# Patient Record
Sex: Male | Born: 1935 | Race: White | Hispanic: No | Marital: Married | Smoking: Former smoker
Health system: Southern US, Community
[De-identification: ages and names within clinical notes are randomized; demographics above are authoritative.]

## PROBLEM LIST (undated history)

## (undated) DIAGNOSIS — K219 Gastro-esophageal reflux disease without esophagitis: Secondary | ICD-10-CM

## (undated) DIAGNOSIS — I10 Essential (primary) hypertension: Secondary | ICD-10-CM

## (undated) DIAGNOSIS — B019 Varicella without complication: Secondary | ICD-10-CM

## (undated) DIAGNOSIS — B059 Measles without complication: Secondary | ICD-10-CM

## (undated) HISTORY — PX: TONSILLECTOMY: SUR1361

## (undated) HISTORY — DX: Essential (primary) hypertension: I10

## (undated) HISTORY — DX: Varicella without complication: B01.9

## (undated) HISTORY — DX: Gastro-esophageal reflux disease without esophagitis: K21.9

## (undated) HISTORY — DX: Measles without complication: B05.9

---

## 2001-02-04 ENCOUNTER — Encounter: Admission: RE | Admit: 2001-02-04 | Discharge: 2001-02-04 | Payer: Self-pay | Admitting: Family Medicine

## 2001-02-04 ENCOUNTER — Encounter: Payer: Self-pay | Admitting: Family Medicine

## 2004-06-20 ENCOUNTER — Ambulatory Visit: Payer: Self-pay | Admitting: Family Medicine

## 2004-10-28 ENCOUNTER — Emergency Department (HOSPITAL_COMMUNITY): Admission: EM | Admit: 2004-10-28 | Discharge: 2004-10-28 | Payer: Self-pay | Admitting: Emergency Medicine

## 2004-11-10 ENCOUNTER — Emergency Department (HOSPITAL_COMMUNITY): Admission: EM | Admit: 2004-11-10 | Discharge: 2004-11-10 | Payer: Self-pay | Admitting: *Deleted

## 2006-03-06 ENCOUNTER — Ambulatory Visit: Payer: Self-pay | Admitting: Gastroenterology

## 2006-03-21 ENCOUNTER — Ambulatory Visit: Payer: Self-pay | Admitting: Gastroenterology

## 2006-03-21 ENCOUNTER — Encounter (INDEPENDENT_AMBULATORY_CARE_PROVIDER_SITE_OTHER): Payer: Self-pay | Admitting: Gastroenterology

## 2006-08-06 ENCOUNTER — Ambulatory Visit: Payer: Self-pay | Admitting: Family Medicine

## 2006-08-06 LAB — CONVERTED CEMR LAB
ALT: 21 units/L (ref 0–40)
AST: 22 units/L (ref 0–37)
Albumin: 3.5 g/dL (ref 3.5–5.2)
Alkaline Phosphatase: 69 units/L (ref 39–117)
BUN: 12 mg/dL (ref 6–23)
Basophils Absolute: 0 10*3/uL (ref 0.0–0.1)
Basophils Relative: 0.5 % (ref 0.0–1.0)
Bilirubin, Direct: 0.2 mg/dL (ref 0.0–0.3)
CO2: 30 meq/L (ref 19–32)
Calcium: 9 mg/dL (ref 8.4–10.5)
Chloride: 104 meq/L (ref 96–112)
Cholesterol: 170 mg/dL (ref 0–200)
Creatinine, Ser: 0.8 mg/dL (ref 0.4–1.5)
Eosinophils Absolute: 0.2 10*3/uL (ref 0.0–0.6)
Eosinophils Relative: 2.3 % (ref 0.0–5.0)
GFR calc Af Amer: 121 mL/min
GFR calc non Af Amer: 100 mL/min
Glucose, Bld: 92 mg/dL (ref 70–99)
HCT: 46 % (ref 39.0–52.0)
HDL: 48.4 mg/dL (ref >39.0)
Hemoglobin: 15.7 g/dL (ref 13.0–17.0)
LDL Cholesterol: 109 mg/dL — ABNORMAL HIGH (ref 0–99)
Lymphocytes Relative: 21.3 % (ref 12.0–46.0)
MCHC: 34.1 g/dL (ref 30.0–36.0)
MCV: 91.5 fL (ref 78.0–100.0)
Monocytes Absolute: 0.7 10*3/uL (ref 0.2–0.7)
Monocytes Relative: 10.8 % (ref 3.0–11.0)
Neutro Abs: 4.5 10*3/uL (ref 1.4–7.7)
Neutrophils Relative %: 65.1 % (ref 43.0–77.0)
PSA: 0.95 ng/mL (ref 0.10–4.00)
Platelets: 207 10*3/uL (ref 150–400)
Potassium: 4.3 meq/L (ref 3.5–5.1)
RBC: 5.03 M/uL (ref 4.22–5.81)
RDW: 12.7 % (ref 11.5–14.6)
Sodium: 141 meq/L (ref 135–145)
TSH: 0.14 microintl units/mL — ABNORMAL LOW (ref 0.35–5.50)
Total Bilirubin: 0.9 mg/dL (ref 0.3–1.2)
Total CHOL/HDL Ratio: 3.5
Total Protein: 6.7 g/dL (ref 6.0–8.3)
Triglycerides: 64 mg/dL (ref 0–149)
VLDL: 13 mg/dL (ref 0–40)
WBC: 6.8 10*3/uL (ref 4.5–10.5)

## 2006-08-07 ENCOUNTER — Ambulatory Visit: Payer: Self-pay

## 2006-10-29 ENCOUNTER — Ambulatory Visit: Payer: Self-pay | Admitting: Family Medicine

## 2007-02-07 ENCOUNTER — Encounter: Payer: Self-pay | Admitting: Family Medicine

## 2007-03-24 ENCOUNTER — Ambulatory Visit: Payer: Self-pay | Admitting: Family Medicine

## 2007-03-25 DIAGNOSIS — I10 Essential (primary) hypertension: Secondary | ICD-10-CM

## 2007-03-25 DIAGNOSIS — R42 Dizziness and giddiness: Secondary | ICD-10-CM

## 2007-03-25 DIAGNOSIS — K219 Gastro-esophageal reflux disease without esophagitis: Secondary | ICD-10-CM

## 2007-04-22 ENCOUNTER — Telehealth: Payer: Self-pay | Admitting: Family Medicine

## 2007-04-22 ENCOUNTER — Ambulatory Visit: Payer: Self-pay | Admitting: Family Medicine

## 2007-09-02 ENCOUNTER — Telehealth: Payer: Self-pay | Admitting: Family Medicine

## 2007-09-03 ENCOUNTER — Telehealth: Payer: Self-pay | Admitting: Family Medicine

## 2007-09-04 ENCOUNTER — Ambulatory Visit: Payer: Self-pay | Admitting: Family Medicine

## 2007-10-24 ENCOUNTER — Ambulatory Visit: Payer: Self-pay | Admitting: Family Medicine

## 2007-10-24 DIAGNOSIS — R609 Edema, unspecified: Secondary | ICD-10-CM

## 2007-11-24 ENCOUNTER — Telehealth: Payer: Self-pay | Admitting: Family Medicine

## 2007-12-09 ENCOUNTER — Telehealth: Payer: Self-pay | Admitting: Family Medicine

## 2007-12-15 ENCOUNTER — Ambulatory Visit: Payer: Self-pay

## 2007-12-15 ENCOUNTER — Encounter: Payer: Self-pay | Admitting: Family Medicine

## 2008-04-02 ENCOUNTER — Telehealth: Payer: Self-pay | Admitting: Family Medicine

## 2008-04-20 ENCOUNTER — Ambulatory Visit: Payer: Self-pay | Admitting: Family Medicine

## 2008-04-26 ENCOUNTER — Encounter: Payer: Self-pay | Admitting: Family Medicine

## 2008-06-28 ENCOUNTER — Encounter: Payer: Self-pay | Admitting: Family Medicine

## 2008-07-21 ENCOUNTER — Telehealth: Payer: Self-pay | Admitting: Family Medicine

## 2008-07-21 DIAGNOSIS — R0989 Other specified symptoms and signs involving the circulatory and respiratory systems: Secondary | ICD-10-CM

## 2008-07-21 DIAGNOSIS — R0609 Other forms of dyspnea: Secondary | ICD-10-CM | POA: Insufficient documentation

## 2008-08-05 ENCOUNTER — Encounter: Payer: Self-pay | Admitting: Family Medicine

## 2008-08-06 ENCOUNTER — Encounter: Payer: Self-pay | Admitting: Cardiology

## 2008-08-06 ENCOUNTER — Ambulatory Visit: Payer: Self-pay | Admitting: Cardiology

## 2008-08-06 DIAGNOSIS — R0602 Shortness of breath: Secondary | ICD-10-CM

## 2008-09-27 ENCOUNTER — Encounter: Payer: Self-pay | Admitting: Family Medicine

## 2009-11-01 ENCOUNTER — Encounter (HOSPITAL_COMMUNITY): Admission: RE | Admit: 2009-11-01 | Discharge: 2009-12-27 | Payer: Self-pay | Admitting: Internal Medicine

## 2010-06-25 LAB — CONVERTED CEMR LAB
ALT: 23 units/L (ref 0–53)
AST: 25 units/L (ref 0–37)
Albumin: 4.1 g/dL (ref 3.5–5.2)
Alkaline Phosphatase: 68 units/L (ref 39–117)
BUN: 12 mg/dL (ref 6–23)
Basophils Absolute: 0 10*3/uL (ref 0.0–0.1)
Basophils Relative: 0 % (ref 0.0–3.0)
Bilirubin, Direct: 0.1 mg/dL (ref 0.0–0.3)
CO2: 32 meq/L (ref 19–32)
Calcium, Total (PTH): 10.1 mg/dL (ref 8.4–10.5)
Calcium: 9.6 mg/dL (ref 8.4–10.5)
Chloride: 102 meq/L (ref 96–112)
Creatinine, Ser: 0.8 mg/dL (ref 0.4–1.5)
Eosinophils Absolute: 0.2 10*3/uL (ref 0.0–0.7)
Eosinophils Relative: 2.4 % (ref 0.0–5.0)
GFR calc Af Amer: 121 mL/min
GFR calc non Af Amer: 100 mL/min
Glucose, Bld: 71 mg/dL (ref 70–99)
HCT: 46.3 % (ref 39.0–52.0)
Hemoglobin: 15.9 g/dL (ref 13.0–17.0)
Lymphocytes Relative: 23.5 % (ref 12.0–46.0)
MCHC: 34.4 g/dL (ref 30.0–36.0)
MCV: 92.9 fL (ref 78.0–100.0)
Monocytes Absolute: 0.8 10*3/uL (ref 0.1–1.0)
Monocytes Relative: 10.8 % (ref 3.0–12.0)
Neutro Abs: 4.7 10*3/uL (ref 1.4–7.7)
Neutrophils Relative %: 63.3 % (ref 43.0–77.0)
PTH: 54.1 pg/mL (ref 14.0–72.0)
Phosphorus: 3.4 mg/dL (ref 2.3–4.6)
Platelets: 195 10*3/uL (ref 150–400)
Potassium: 4.3 meq/L (ref 3.5–5.1)
RBC: 4.99 M/uL (ref 4.22–5.81)
RDW: 12.2 % (ref 11.5–14.6)
Sodium: 141 meq/L (ref 135–145)
Total Bilirubin: 1 mg/dL (ref 0.3–1.2)
Total Protein: 7.5 g/dL (ref 6.0–8.3)
WBC: 7.5 10*3/uL (ref 4.5–10.5)

## 2011-04-05 ENCOUNTER — Encounter: Payer: Self-pay | Admitting: Gastroenterology

## 2011-12-18 DIAGNOSIS — H269 Unspecified cataract: Secondary | ICD-10-CM | POA: Diagnosis not present

## 2012-01-25 DIAGNOSIS — I10 Essential (primary) hypertension: Secondary | ICD-10-CM | POA: Diagnosis not present

## 2012-01-25 DIAGNOSIS — Z125 Encounter for screening for malignant neoplasm of prostate: Secondary | ICD-10-CM | POA: Diagnosis not present

## 2012-02-11 DIAGNOSIS — Z0000 Encounter for general adult medical examination without abnormal findings: Secondary | ICD-10-CM | POA: Diagnosis not present

## 2012-02-11 DIAGNOSIS — Z125 Encounter for screening for malignant neoplasm of prostate: Secondary | ICD-10-CM | POA: Diagnosis not present

## 2012-02-11 DIAGNOSIS — I10 Essential (primary) hypertension: Secondary | ICD-10-CM | POA: Diagnosis not present

## 2012-02-11 DIAGNOSIS — E079 Disorder of thyroid, unspecified: Secondary | ICD-10-CM | POA: Diagnosis not present

## 2012-02-12 DIAGNOSIS — Z1212 Encounter for screening for malignant neoplasm of rectum: Secondary | ICD-10-CM | POA: Diagnosis not present

## 2012-02-15 ENCOUNTER — Encounter: Payer: Self-pay | Admitting: Gastroenterology

## 2012-08-25 ENCOUNTER — Telehealth: Payer: Self-pay | Admitting: Family Medicine

## 2012-08-25 NOTE — Telephone Encounter (Signed)
Pt must sign the DPR form, we cannot discuss pt's medical issues without this. I tried to call and no answer.

## 2012-08-25 NOTE — Telephone Encounter (Signed)
Pt's wife would like to discuss husband's situation. Husband has been experiencing the inability to remember things.  Getting confused more and more often. Husband has refused to let wife come to appointment. She would like to discuss some things prior to his appointment.  Advised wife she needed DPR signed, but I would relay message.

## 2012-09-01 ENCOUNTER — Encounter: Payer: Self-pay | Admitting: Family Medicine

## 2012-09-01 ENCOUNTER — Ambulatory Visit (INDEPENDENT_AMBULATORY_CARE_PROVIDER_SITE_OTHER): Payer: Medicare Other | Admitting: Family Medicine

## 2012-09-01 VITALS — BP 120/70 | HR 49 | Temp 97.5°F | Ht 67.5 in | Wt 186.0 lb

## 2012-09-01 DIAGNOSIS — R42 Dizziness and giddiness: Secondary | ICD-10-CM | POA: Diagnosis not present

## 2012-09-01 DIAGNOSIS — I10 Essential (primary) hypertension: Secondary | ICD-10-CM

## 2012-09-01 DIAGNOSIS — I499 Cardiac arrhythmia, unspecified: Secondary | ICD-10-CM | POA: Diagnosis not present

## 2012-09-01 DIAGNOSIS — I498 Other specified cardiac arrhythmias: Secondary | ICD-10-CM

## 2012-09-01 DIAGNOSIS — R001 Bradycardia, unspecified: Secondary | ICD-10-CM

## 2012-09-01 LAB — BASIC METABOLIC PANEL
Chloride: 105 mEq/L (ref 96–112)
GFR: 86.05 mL/min (ref >60.00)
Potassium: 4.6 mEq/L (ref 3.5–5.1)
Sodium: 139 mEq/L (ref 135–145)

## 2012-09-01 LAB — HEPATIC FUNCTION PANEL
ALT: 20 U/L (ref 0–53)
AST: 22 U/L (ref 0–37)
Alkaline Phosphatase: 70 U/L (ref 39–117)
Bilirubin, Direct: 0.2 mg/dL (ref 0.0–0.3)
Total Bilirubin: 1 mg/dL (ref 0.3–1.2)

## 2012-09-01 LAB — CBC WITH DIFFERENTIAL/PLATELET
Basophils Absolute: 0 10*3/uL (ref 0.0–0.1)
Basophils Relative: 0.4 % (ref 0.0–3.0)
HCT: 45.6 % (ref 39.0–52.0)
Hemoglobin: 15.3 g/dL (ref 13.0–17.0)
Lymphocytes Relative: 15.3 % (ref 12.0–46.0)
Lymphs Abs: 1.2 10*3/uL (ref 0.7–4.0)
MCHC: 33.6 g/dL (ref 30.0–36.0)
Monocytes Relative: 8.8 % (ref 3.0–12.0)
Neutro Abs: 5.9 10*3/uL (ref 1.4–7.7)
RBC: 4.96 Mil/uL (ref 4.22–5.81)
RDW: 13.3 % (ref 11.5–14.6)

## 2012-09-01 LAB — LIPID PANEL
Cholesterol: 172 mg/dL (ref 0–200)
LDL Cholesterol: 105 mg/dL — ABNORMAL HIGH (ref 0–99)
Total CHOL/HDL Ratio: 3

## 2012-09-01 LAB — POCT URINALYSIS DIPSTICK
Bilirubin, UA: NEGATIVE
Glucose, UA: NEGATIVE
Ketones, UA: NEGATIVE
pH, UA: 6

## 2012-09-01 MED ORDER — AMLODIPINE-OLMESARTAN 10-40 MG PO TABS: 1.0000 | ORAL_TABLET | Freq: Every day | ORAL | Status: DC

## 2012-09-01 MED ORDER — NEBIVOLOL HCL 10 MG PO TABS: 10.0000 mg | ORAL_TABLET | Freq: Two times a day (BID) | ORAL | Status: DC

## 2012-09-01 MED ORDER — ESOMEPRAZOLE MAGNESIUM 40 MG PO CPDR: 40.0000 mg | DELAYED_RELEASE_CAPSULE | Freq: Every day | ORAL | Status: DC

## 2012-09-01 NOTE — Progress Notes (Signed)
  Subjective:    Patient ID: Travis Ware, male    DOB: Nov 23, 1929, 79 y.o.   MRN: [id number]  HPI 79 yr old male here to establish after transfering from Dr. Creola Corn. He had a cpx about 8 months ago and apparently he has been fairly healthy. He feels good except for 2 things. He describes some brief dizziness or lightheadedness for a few seconds when he sits up from lying in bed or when he stands up from sitting. This passes quickly and he denies any other symptoms. No palpitations or SOB or chest pains. He says this started about 6 months ago. 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 He feels like he can do whatever her wants to do. He also mentions some intermittent left shoulder pain for the past few months. No hx of trauma. 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 They often travel to [place] to search for antiques.    Review of Systems  Constitutional: Negative.   HENT: Negative.   Eyes: Negative.   Respiratory: Negative.   Cardiovascular: Negative.   Gastrointestinal: Negative.   Musculoskeletal: Positive for arthralgias.  Neurological: Positive for dizziness and light-headedness. Negative for tremors, seizures, syncope, facial asymmetry, speech difficulty, weakness, numbness and headaches.       Objective:   Physical Exam  Constitutional: He is oriented to person, place, and time. He appears well-developed and well-nourished. No distress.  Eyes: Conjunctivae are normal. Pupils are equal, round, and reactive to light.  Neck: No thyromegaly present.  Cardiovascular: Regular rhythm, normal heart sounds and intact distal pulses.  Exam reveals no gallop and no friction rub.   No murmur heard. No carotid bruits are heard. EKG shows sinus bradycardia   Pulmonary/Chest: Effort normal and breath sounds normal. No respiratory distress. He has no wheezes. He has no rales.  Lymphadenopathy:    He has no  cervical adenopathy.  Neurological: He is alert and oriented to person, place, and time. No cranial nerve deficit. He exhibits normal muscle tone. Coordination normal.          Assessment & Plan:  Introductory visit for this 79 yr old male who complains of probable postural hypotension. He is also very bradycardic which is contributing to the problem. We will decrease the dose of his Bystolic to 10 mg bid and get some baseline labs today. Set up an ECHO and some carotid dopplers soon. Recheck with me in 2 weeks.

## 2012-09-04 NOTE — Progress Notes (Signed)
Quick Note:  I left voice message with results. ______ 

## 2012-09-05 ENCOUNTER — Encounter (INDEPENDENT_AMBULATORY_CARE_PROVIDER_SITE_OTHER): Payer: Medicare Other

## 2012-09-05 DIAGNOSIS — I6529 Occlusion and stenosis of unspecified carotid artery: Secondary | ICD-10-CM

## 2012-09-05 DIAGNOSIS — R42 Dizziness and giddiness: Secondary | ICD-10-CM

## 2012-09-08 ENCOUNTER — Ambulatory Visit (HOSPITAL_COMMUNITY): Payer: Medicare Other | Attending: Cardiovascular Disease

## 2012-09-08 DIAGNOSIS — I498 Other specified cardiac arrhythmias: Secondary | ICD-10-CM | POA: Insufficient documentation

## 2012-09-08 DIAGNOSIS — R42 Dizziness and giddiness: Secondary | ICD-10-CM | POA: Insufficient documentation

## 2012-09-08 DIAGNOSIS — I495 Sick sinus syndrome: Secondary | ICD-10-CM

## 2012-09-08 NOTE — Progress Notes (Signed)
Echocardiogram performed.  

## 2012-09-15 ENCOUNTER — Ambulatory Visit (INDEPENDENT_AMBULATORY_CARE_PROVIDER_SITE_OTHER): Payer: Medicare Other | Admitting: Family Medicine

## 2012-09-15 ENCOUNTER — Encounter: Payer: Self-pay | Admitting: Family Medicine

## 2012-09-15 VITALS — BP 130/84 | HR 51 | Temp 97.6°F | Wt 189.0 lb

## 2012-09-15 DIAGNOSIS — R42 Dizziness and giddiness: Secondary | ICD-10-CM | POA: Diagnosis not present

## 2012-09-15 DIAGNOSIS — I10 Essential (primary) hypertension: Secondary | ICD-10-CM

## 2012-09-15 NOTE — Progress Notes (Signed)
Quick Note:  Pt here today for OV, will discuss then. ______

## 2012-09-15 NOTE — Progress Notes (Signed)
  Subjective:    Patient ID: Travis Ware, male    DOB: 10-Mar-1930, 79 y.o.   MRN: [id number]  HPI Here to follow up on some brief spells of dizziness when he stands up from sitting or gets out of bed. We felt this represented some orthostasis. We decreased his Bystolic to 10 mg bid and this has helped somewhat. He had labs, an ECHO, and carotid dopplers in the past 2 weeks, and all these came out unremarkable.    Review of Systems  Constitutional: Negative.   Respiratory: Negative.   Cardiovascular: Negative.   Neurological: Positive for dizziness.       Objective:   Physical Exam  Constitutional: He is oriented to person, place, and time. He appears well-developed and well-nourished. No distress.  Neck: No thyromegaly present.  Cardiovascular: Normal rate, regular rhythm, normal heart sounds and intact distal pulses.   Pulmonary/Chest: Effort normal and breath sounds normal.  Lymphadenopathy:    He has no cervical adenopathy.  Neurological: He is alert and oriented to person, place, and time.          Assessment & Plan:  He seems to tolerate the dizziness well, so we agreed to simply observe this for now. Recheck prn

## 2012-09-15 NOTE — Progress Notes (Signed)
Quick Note:  Pt is here now for office visit and will go over then. ______

## 2012-11-03 ENCOUNTER — Telehealth: Payer: Self-pay | Admitting: Family Medicine

## 2012-11-03 NOTE — Telephone Encounter (Signed)
Call attempt to patient regarding medication concerns/refill request. No answer. Voicemail message left for patient to return call to office number. No contact with patient.

## 2012-11-05 ENCOUNTER — Ambulatory Visit (INDEPENDENT_AMBULATORY_CARE_PROVIDER_SITE_OTHER): Payer: Medicare Other | Admitting: Family Medicine

## 2012-11-05 ENCOUNTER — Encounter: Payer: Self-pay | Admitting: Family Medicine

## 2012-11-05 VITALS — BP 124/86 | HR 98 | Temp 97.6°F | Wt 185.0 lb

## 2012-11-05 DIAGNOSIS — I10 Essential (primary) hypertension: Secondary | ICD-10-CM | POA: Diagnosis not present

## 2012-11-05 DIAGNOSIS — K219 Gastro-esophageal reflux disease without esophagitis: Secondary | ICD-10-CM | POA: Diagnosis not present

## 2012-11-05 NOTE — Progress Notes (Signed)
  Subjective:    Patient ID: Travis Ware, male    DOB: June 29, 1929, 79 y.o.   MRN: [id number]  HPI Here to follow up. His dizziness has improved quite a bit and he no longer complains of this. He stopped all his medications a month ago because he did not think he needed them. He feels fine.    Review of Systems  Constitutional: Negative.   Respiratory: Negative.   Cardiovascular: Negative.   Neurological: Negative.        Objective:   Physical Exam  Constitutional: He appears well-developed and well-nourished.  Cardiovascular: Normal rate, regular rhythm, normal heart sounds and intact distal pulses.   Pulmonary/Chest: Effort normal and breath sounds normal.          Assessment & Plan:  He seems to be doing well. We will get a cpx with labs in August. I asked him to check his BP twice a week at home.

## 2013-02-05 ENCOUNTER — Encounter: Payer: Self-pay | Admitting: Family Medicine

## 2013-02-05 ENCOUNTER — Ambulatory Visit (INDEPENDENT_AMBULATORY_CARE_PROVIDER_SITE_OTHER): Payer: Medicare Other | Admitting: Family Medicine

## 2013-02-05 VITALS — BP 112/80 | HR 64 | Temp 97.6°F | Ht 67.5 in | Wt 179.0 lb

## 2013-02-05 DIAGNOSIS — I10 Essential (primary) hypertension: Secondary | ICD-10-CM | POA: Diagnosis not present

## 2013-02-05 DIAGNOSIS — R0609 Other forms of dyspnea: Secondary | ICD-10-CM | POA: Diagnosis not present

## 2013-02-05 DIAGNOSIS — N139 Obstructive and reflux uropathy, unspecified: Secondary | ICD-10-CM

## 2013-02-05 DIAGNOSIS — K219 Gastro-esophageal reflux disease without esophagitis: Secondary | ICD-10-CM | POA: Diagnosis not present

## 2013-02-05 DIAGNOSIS — N401 Enlarged prostate with lower urinary tract symptoms: Secondary | ICD-10-CM | POA: Diagnosis not present

## 2013-02-05 DIAGNOSIS — N138 Other obstructive and reflux uropathy: Secondary | ICD-10-CM

## 2013-02-05 DIAGNOSIS — R0989 Other specified symptoms and signs involving the circulatory and respiratory systems: Secondary | ICD-10-CM

## 2013-02-05 DIAGNOSIS — R42 Dizziness and giddiness: Secondary | ICD-10-CM

## 2013-02-05 NOTE — Progress Notes (Signed)
  Subjective:    Patient ID: Travis Ware, male    DOB: 18-Mar-1930, 79 y.o.   MRN: [id number]  HPI 79 yr old male for a cpx. He feels well. He stopped his BP meds several months ago and his BP has remained well controlled. 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    Review of Systems  Constitutional: Negative.   HENT: Negative.   Eyes: Negative.   Respiratory: Negative.   Cardiovascular: Negative.   Gastrointestinal: Negative.   Genitourinary: Negative.   Musculoskeletal: Negative.   Skin: Negative.   Neurological: Negative.   Psychiatric/Behavioral: Negative.        Objective:   Physical Exam  Constitutional: He is oriented to person, place, and time. He appears well-developed and well-nourished. No distress.  HENT:  Head: Normocephalic and atraumatic.  Right Ear: External ear normal.  Left Ear: External ear normal.  Nose: Nose normal.  Mouth/Throat: Oropharynx is clear and moist. No oropharyngeal exudate.  Eyes: Conjunctivae and EOM are normal. Pupils are equal, round, and reactive to light. Right eye exhibits no discharge. Left eye exhibits no discharge. No scleral icterus.  Neck: Neck supple. No JVD present. No tracheal deviation present. No thyromegaly present.  Cardiovascular: Normal rate, regular rhythm, normal heart sounds and intact distal pulses.  Exam reveals no gallop and no friction rub.   No murmur heard. Pulmonary/Chest: Effort normal and breath sounds normal. No respiratory distress. He has no wheezes. He has no rales. He exhibits no tenderness.  Abdominal: Soft. Bowel sounds are normal. He exhibits no distension and no mass. There is no tenderness. There is no rebound and no guarding.  Genitourinary: Rectum normal, prostate normal and penis normal. Guaiac negative stool. No penile tenderness.  Musculoskeletal: Normal range of motion. He exhibits no edema and no tenderness.  Lymphadenopathy:    He has no cervical adenopathy.  Neurological: He is  alert and oriented to person, place, and time. He has normal reflexes. No cranial nerve deficit. He exhibits normal muscle tone. Coordination normal.  Skin: Skin is warm and dry. No rash noted. He is not diaphoretic. No erythema. No pallor.  Psychiatric: He has a normal mood and affect. His behavior is normal. Judgment and thought content normal.          Assessment & Plan:  Well exam. Get a PSA today.

## 2013-02-06 NOTE — Progress Notes (Signed)
Quick Note:  I left voice message with results. ______ 

## 2013-06-22 DIAGNOSIS — M19019 Primary osteoarthritis, unspecified shoulder: Secondary | ICD-10-CM | POA: Diagnosis not present

## 2014-02-03 ENCOUNTER — Encounter: Payer: Self-pay | Admitting: Family Medicine

## 2014-02-03 ENCOUNTER — Ambulatory Visit (INDEPENDENT_AMBULATORY_CARE_PROVIDER_SITE_OTHER): Payer: Medicare Other | Admitting: Family Medicine

## 2014-02-03 VITALS — BP 153/76 | HR 67 | Temp 97.7°F | Ht 67.5 in | Wt 183.0 lb

## 2014-02-03 DIAGNOSIS — M545 Low back pain, unspecified: Secondary | ICD-10-CM

## 2014-02-03 DIAGNOSIS — Z23 Encounter for immunization: Secondary | ICD-10-CM | POA: Diagnosis not present

## 2014-02-03 NOTE — Progress Notes (Signed)
   Subjective:    Patient ID: Travis Ware, male    DOB: 25-Oct-1929, 80 y.o.   MRN: [id number]  HPI Here for 5 days of low back pain. This started while he was on vacation at the beach last weekend and the car ride home seemed to make it worse. He was stiff and sore but no pain went to the legs. He did nothing for it, but it has been gradually improving every day since then.    Review of Systems  Constitutional: Negative.   Musculoskeletal: Positive for back pain.       Objective:   Physical Exam  Constitutional: He appears well-developed and well-nourished. No distress.  Musculoskeletal:  The lower back is normal today with no tenderness or spasm, full ROM           Assessment & Plan:  He seems to be recovering well. He probably has some arthritis in the spine. Next time I advised him to try heat and ibuprofen

## 2014-02-03 NOTE — Progress Notes (Signed)
Pre visit review using our clinic review tool, if applicable. No additional management support is needed unless otherwise documented below in the visit note. 

## 2014-02-08 ENCOUNTER — Ambulatory Visit (INDEPENDENT_AMBULATORY_CARE_PROVIDER_SITE_OTHER): Payer: Medicare Other | Admitting: Family Medicine

## 2014-02-08 ENCOUNTER — Encounter: Payer: Medicare Other | Admitting: Family Medicine

## 2014-02-08 ENCOUNTER — Encounter: Payer: Self-pay | Admitting: Family Medicine

## 2014-02-08 VITALS — BP 174/83 | HR 62 | Ht 68.0 in | Wt 184.0 lb

## 2014-02-08 DIAGNOSIS — N138 Other obstructive and reflux uropathy: Secondary | ICD-10-CM

## 2014-02-08 DIAGNOSIS — I10 Essential (primary) hypertension: Secondary | ICD-10-CM | POA: Diagnosis not present

## 2014-02-08 DIAGNOSIS — R609 Edema, unspecified: Secondary | ICD-10-CM

## 2014-02-08 DIAGNOSIS — N139 Obstructive and reflux uropathy, unspecified: Secondary | ICD-10-CM | POA: Diagnosis not present

## 2014-02-08 DIAGNOSIS — N401 Enlarged prostate with lower urinary tract symptoms: Secondary | ICD-10-CM | POA: Diagnosis not present

## 2014-02-08 DIAGNOSIS — R413 Other amnesia: Secondary | ICD-10-CM

## 2014-02-08 DIAGNOSIS — H538 Other visual disturbances: Secondary | ICD-10-CM

## 2014-02-08 DIAGNOSIS — K219 Gastro-esophageal reflux disease without esophagitis: Secondary | ICD-10-CM

## 2014-02-08 LAB — PSA: PSA: 1.3 ng/mL (ref 0.10–4.00)

## 2014-02-08 LAB — CBC WITH DIFFERENTIAL/PLATELET
Basophils Absolute: 0 10*3/uL (ref 0.0–0.1)
Basophils Relative: 0.4 % (ref 0.0–3.0)
EOS PCT: 3.3 % (ref 0.0–5.0)
Eosinophils Absolute: 0.3 10*3/uL (ref 0.0–0.7)
HEMATOCRIT: 50.2 % (ref 39.0–52.0)
HEMOGLOBIN: 16.7 g/dL (ref 13.0–17.0)
Lymphocytes Relative: 18.2 % (ref 12.0–46.0)
Lymphs Abs: 1.4 10*3/uL (ref 0.7–4.0)
MCHC: 33.3 g/dL (ref 30.0–36.0)
MCV: 93.8 fl (ref 78.0–100.0)
MONO ABS: 0.8 10*3/uL (ref 0.1–1.0)
MONOS PCT: 9.8 % (ref 3.0–12.0)
NEUTROS ABS: 5.5 10*3/uL (ref 1.4–7.7)
Neutrophils Relative %: 68.3 % (ref 43.0–77.0)
Platelets: 191 10*3/uL (ref 150.0–400.0)
RBC: 5.35 Mil/uL (ref 4.22–5.81)
RDW: 13.2 % (ref 11.5–15.5)
WBC: 8 10*3/uL (ref 4.0–10.5)

## 2014-02-08 LAB — LIPID PANEL
Cholesterol: 172 mg/dL (ref 0–200)
HDL: 48.1 mg/dL
LDL Cholesterol: 105 mg/dL — ABNORMAL HIGH (ref 0–99)
NonHDL: 123.9
Total CHOL/HDL Ratio: 4
Triglycerides: 97 mg/dL (ref 0.0–149.0)
VLDL: 19.4 mg/dL (ref 0.0–40.0)

## 2014-02-08 LAB — BASIC METABOLIC PANEL
BUN: 15 mg/dL (ref 6–23)
CALCIUM: 9.4 mg/dL (ref 8.4–10.5)
CO2: 28 meq/L (ref 19–32)
Chloride: 105 mEq/L (ref 96–112)
Creatinine, Ser: 0.9 mg/dL (ref 0.4–1.5)
GFR: 89.17 mL/min (ref >60.00)
Glucose, Bld: 88 mg/dL (ref 70–99)
Potassium: 5 mEq/L (ref 3.5–5.1)
SODIUM: 141 meq/L (ref 135–145)

## 2014-02-08 LAB — HEPATIC FUNCTION PANEL
ALK PHOS: 77 U/L (ref 39–117)
ALT: 19 U/L (ref 0–53)
AST: 19 U/L (ref 0–37)
Albumin: 4 g/dL (ref 3.5–5.2)
BILIRUBIN DIRECT: 0.2 mg/dL (ref 0.0–0.3)
TOTAL PROTEIN: 6.9 g/dL (ref 6.0–8.3)
Total Bilirubin: 0.8 mg/dL (ref 0.2–1.2)

## 2014-02-08 LAB — POCT URINALYSIS DIPSTICK
BILIRUBIN UA: NEGATIVE
Glucose, UA: NEGATIVE
KETONES UA: NEGATIVE
Leukocytes, UA: NEGATIVE
Nitrite, UA: NEGATIVE
Protein, UA: NEGATIVE
RBC UA: NEGATIVE
SPEC GRAV UA: 1.02
UROBILINOGEN UA: 0.2
pH, UA: 5.5

## 2014-02-08 LAB — FOLATE: Folate: 11.1 ng/mL (ref >5.9)

## 2014-02-08 LAB — TSH: TSH: 0.04 u[IU]/mL — AB (ref 0.35–4.50)

## 2014-02-08 LAB — VITAMIN B12: VITAMIN B 12: 248 pg/mL (ref 211–911)

## 2014-02-08 MED ORDER — LOSARTAN POTASSIUM-HCTZ 50-12.5 MG PO TABS
1.0000 | ORAL_TABLET | Freq: Every day | ORAL | Status: DC
Start: 2014-02-08 — End: 2014-09-01

## 2014-02-08 NOTE — Progress Notes (Signed)
   Subjective:    Patient ID: Travis Ware, male    DOB: 05/10/1930, 80 y.o.   MRN: [id number]  HPI 80 yr old male here with his wife for a cpx. He states that he feels fine but his wife has serious concerns about his memory and his ability to take care of himself. She describes how he loses things like his car keys, how he gets lost when driving, how he often does not know what day of the week it is, etc. He lives alone and he does not take care of his house or his dog properly. He does not launder his clothes, etc. I have received a letter from his daughter which details the same concerns.    Review of Systems  Constitutional: Negative.   HENT: Negative.   Eyes: Negative.   Respiratory: Negative.   Cardiovascular: Negative.   Gastrointestinal: Negative.   Genitourinary: Negative.   Musculoskeletal: Negative.   Skin: Negative.   Neurological: Negative.   Psychiatric/Behavioral: Negative.        Objective:   Physical Exam  Constitutional: He is oriented to person, place, and time. He appears well-developed and well-nourished. No distress.  HENT:  Head: Normocephalic and atraumatic.  Right Ear: External ear normal.  Left Ear: External ear normal.  Nose: Nose normal.  Mouth/Throat: Oropharynx is clear and moist. No oropharyngeal exudate.  Eyes: Conjunctivae and EOM are normal. Pupils are equal, round, and reactive to light. Right eye exhibits no discharge. Left eye exhibits no discharge. No scleral icterus.  Neck: Neck supple. No JVD present. No tracheal deviation present. No thyromegaly present.  Cardiovascular: Normal rate, regular rhythm, normal heart sounds and intact distal pulses.  Exam reveals no gallop and no friction rub.   No murmur heard. EKG normal   Pulmonary/Chest: Effort normal and breath sounds normal. No respiratory distress. He has no wheezes. He has no rales. He exhibits no tenderness.  Abdominal: Soft. Bowel sounds are normal. He exhibits no distension and no  mass. There is no tenderness. There is no rebound and no guarding.  Genitourinary: Rectum normal, prostate normal and penis normal. Guaiac negative stool. No penile tenderness.  Musculoskeletal: Normal range of motion. He exhibits no edema and no tenderness.  Lymphadenopathy:    He has no cervical adenopathy.  Neurological: He is alert and oriented to person, place, and time. He has normal reflexes. No cranial nerve deficit. He exhibits normal muscle tone. Coordination normal.  Skin: Skin is warm and dry. No rash noted. He is not diaphoretic. No erythema. No pallor.  Psychiatric: He has a normal mood and affect. His behavior is normal. Judgment and thought content normal.          Assessment & Plan:  Well exam. Get fasting labs. Start on Losartsan HCT for the HTN. We did a MMSE exam today and he scored a 25 out of 30, but his wife says that today he is functioning better than he does on other days. We will refer to Neurology for further testing.

## 2014-02-08 NOTE — Progress Notes (Signed)
Pre visit review using our clinic review tool, if applicable. No additional management support is needed unless otherwise documented below in the visit note. 

## 2014-02-11 DIAGNOSIS — H2510 Age-related nuclear cataract, unspecified eye: Secondary | ICD-10-CM | POA: Diagnosis not present

## 2014-03-10 ENCOUNTER — Ambulatory Visit: Payer: Medicare Other | Admitting: Neurology

## 2014-03-12 ENCOUNTER — Encounter: Payer: Self-pay | Admitting: Neurology

## 2014-03-12 ENCOUNTER — Telehealth: Payer: Self-pay | Admitting: Family Medicine

## 2014-03-12 ENCOUNTER — Ambulatory Visit (INDEPENDENT_AMBULATORY_CARE_PROVIDER_SITE_OTHER): Payer: Medicare Other | Admitting: Neurology

## 2014-03-12 ENCOUNTER — Telehealth: Payer: Self-pay | Admitting: Neurology

## 2014-03-12 VITALS — BP 140/88 | HR 84 | Ht 70.0 in | Wt 185.0 lb

## 2014-03-12 DIAGNOSIS — R413 Other amnesia: Secondary | ICD-10-CM | POA: Diagnosis not present
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
## 2014-03-12 NOTE — Telephone Encounter (Signed)
Pt's wife calling to report pt had initial visit with Dr. Ellouise Newer today at [facility].  Wife states she was under the impression pt was having a MRI today.  However, she states Dr. Amparo Bristol office set up an appt for pt to have MRI on Sunday.  The wife feels this visit was a waste of time and is not sure what is going on.  However, she did not go to the appt with the patient but her daughter did and the pt refused the let daughter go in the room so she is unsure of what happened as well.  I advised Mrs. Derousse to contact Dr. Lars Masson office to get clarification on what the patient was instructed today as [name] would not know this.  I provided the number to Dr. Amparo Bristol office and pt's wife agreed to call and get clarification.  However, she asked that a note be sent to [name] letting him know of what was going on.

## 2014-03-12 NOTE — Patient Instructions (Signed)
1. Schedule MRI brain without contrast 2. Follow-up in 3 months

## 2014-03-12 NOTE — Telephone Encounter (Signed)
Pt wife carol Abair called and said that  We need to call and resch MRI please cal [phone]

## 2014-03-12 NOTE — Telephone Encounter (Signed)
Returned call. Advised that they can call [place] scan themselves.

## 2014-03-12 NOTE — Telephone Encounter (Signed)
Noted. I did review Dr. Amparo Bristol note.

## 2014-03-21 ENCOUNTER — Other Ambulatory Visit: Payer: Medicare Other

## 2014-03-23 ENCOUNTER — Other Ambulatory Visit: Payer: Medicare Other

## 2014-03-31 ENCOUNTER — Telehealth: Payer: Self-pay | Admitting: Family Medicine

## 2014-03-31 NOTE — Telephone Encounter (Signed)
Wife states dr fry told them he would call Travis Ware prior to MRI to remind him of the importance of this MRI. Travis Ware has appt sat at 11:10 for the MRI. Travis Ware will listen to dr fry. Friday will be a good say to call.

## 2014-04-02 ENCOUNTER — Telehealth: Payer: Self-pay | Admitting: Neurology

## 2014-04-02 NOTE — Telephone Encounter (Signed)
Pt wife Arbie Cookey called and wants to talk to someone about husband not wanting to go to MRI please call [phone] or [phone]

## 2014-04-02 NOTE — Telephone Encounter (Signed)
Spoke with patient's wife. States he is now refusing to have MRI, so she has cancelled that appt. She states he keeps saying that it's nothing wrong with him. She states she is really having a hard time dealing with him.

## 2014-04-03 ENCOUNTER — Inpatient Hospital Stay
Admission: RE | Admit: 2014-04-03 | Discharge: 2014-04-03 | Disposition: A | Discharge status: Home / Self Care | Payer: Medicare Other | Source: Ambulatory Visit | Attending: Neurology | Admitting: Neurology

## 2014-04-05 NOTE — Telephone Encounter (Signed)
I understand that he refused to go

## 2014-04-05 NOTE — Telephone Encounter (Signed)
Dr. Delice Lesch & I did discuss this last week before she left she is aware that Travis Ware is refusing to proceed with the MRI because he doesn 't think there is "anything wrong with him". Unfortunately there is not much we can do since he's not agreeing to do any further testing or evaluation. Travis Ware is in the process of trying to get POA established. When I spoke with her on Friday I did tell her to call us if there is anything else she felt that we could assist with.

## 2014-04-05 NOTE — Telephone Encounter (Signed)
Is there anything we can do to help?  Looks like both Dr. Delice Lesch and [name] are aware of this situation.  If he was open to neuropsych testing, it may help him to have some insight but doesn't sound like that will be an option that he is willing to do?

## 2014-06-16 ENCOUNTER — Telehealth: Payer: Self-pay | Admitting: Neurology

## 2014-06-16 NOTE — Telephone Encounter (Signed)
Pt called and canceled appt for 06-18-14 and did not resch

## 2014-06-18 ENCOUNTER — Ambulatory Visit: Payer: Medicare Other | Admitting: Neurology

## 2014-08-16 ENCOUNTER — Telehealth: Payer: Self-pay | Admitting: *Deleted

## 2014-08-16 ENCOUNTER — Ambulatory Visit (INDEPENDENT_AMBULATORY_CARE_PROVIDER_SITE_OTHER): Payer: Medicare Other | Admitting: Family Medicine

## 2014-08-16 ENCOUNTER — Encounter: Payer: Self-pay | Admitting: Family Medicine

## 2014-08-16 VITALS — BP 180/85 | HR 64 | Temp 97.9°F | Ht 70.0 in | Wt 181.0 lb

## 2014-08-16 DIAGNOSIS — S20212A Contusion of left front wall of thorax, initial encounter: Secondary | ICD-10-CM

## 2014-08-16 NOTE — Telephone Encounter (Signed)
PLEASE NOTE: All timestamps contained within this report are represented as [place] Standard Time. CONFIDENTIALTY NOTICE: This fax transmission is intended only for the addressee. It contains information that is legally privileged, confidential or otherwise protected from use or disclosure. If you are not the intended recipient, you are strictly prohibited from reviewing, disclosing, copying using or disseminating any of this information or taking any action in reliance on or regarding this information. If you have received this fax in error, please notify us immediately by telephone so that we can arrange for its return to [place]. Phone: [phone], Toll-Free: [phone], Fax: [phone] [place]: 1 of 2 Call Id: [id number] [facility] Night - Client [place] Patient Name: Travis Ware Gender: Male DOB: 01/07/1930 Age: 81 Y 25 D Return Phone Number: [phone] (Primary) Address: [address] City/State/Zip: [place] [address] Client [facility] Night - Client Client Site [facility] - Night Physician [place], [place] Type Call Call Type Triage / Clinical Relationship To Patient Self Return Phone Number [phone] (Primary) Chief Complaint Chest Injury Initial Comment Caller states wants appt w/Dr Sarajane Jews; fell off tractor 3 days ago; still having pain in left side of chest; no difficulty breathing PreDisposition Call Doctor Nurse Assessment Nurse: Wynetta Emery, RN, Baker Janus Date/Time Eilene Ghazi Time): 08/15/2014 8:35:41 AM Confirm and document reason for call. If symptomatic, describe symptoms. ---Iona Beard slipped off the tractor and hit left side of chest on tractor and no bruising to area it is very sore --no problems breathing. Has the patient traveled out of the country within the last 30 days? ---No Does the patient require triage? ---Yes Related visit to physician within the  last 2 weeks? ---No Does the PT have any chronic conditions? (i.e. diabetes, asthma, etc.) ---No Guidelines Guideline Title Affirmed Question Affirmed Notes Nurse Date/Time (Eastern Time) Chest Injury [1] High-risk adult (e.g., age > 52, osteoporosis, chronic steroid use) AND [2] still hurts Ivin Booty 08/15/2014 8:39:37 AM Disp. Time Eilene Ghazi Time) Disposition Final User 08/15/2014 8:43:45 AM See Physician within 24 Hours Yes Wynetta Emery, RN, Christin Bach Understands: Yes Disagree/Comply: Comply Care Advice Given Per Guideline PLEASE NOTE: All timestamps contained within this report are represented as [place] Standard Time. CONFIDENTIALTY NOTICE: This fax transmission is intended only for the addressee. It contains information that is legally privileged, confidential or otherwise protected from use or disclosure. If you are not the intended recipient, you are strictly prohibited from reviewing, disclosing, copying using or disseminating any of this information or taking any action in reliance on or regarding this information. If you have received this fax in error, please notify us immediately by telephone so that we can arrange for its return to [place]. Phone: [phone], Toll-Free: [phone], Fax: [phone] [place]: 2 of 2 Call Id: [id number] Care Advice Given Per Guideline SEE PHYSICIAN WITHIN 24 HOURS: * IF OFFICE WILL BE OPEN: You need to be examined within the next 24 hours. Call your doctor when the office opens, and make an appointment. IBUPROFEN (E.G., MOTRIN, ADVIL): * Take 400 mg (two 200 mg pills) by mouth every 6 hours as needed. * Another choice is to take 600 mg (three 200 mg pills) by mouth every 8 hours as needed. * You become worse. CARE ADVICE given per Chest Injury (Adult) guideline. After Care Instructions Given Call Event Type User Date / Time Description Referrals REFERRED TO PCP OFFICE

## 2014-08-16 NOTE — Telephone Encounter (Signed)
Pt was just here in office to see [name].

## 2014-08-16 NOTE — Progress Notes (Signed)
   Subjective:    Patient ID: Travis Ware, male    DOB: 04-30-1930, 81 y.o.   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 As he was climbing off a [place] his foot slipped and he fell, causing his left ribs to strike a metal bar. He had sharp pains in the area for the first day but these have been improving every day since then. No SOB.    Review of Systems  Constitutional: Negative.   Respiratory: Negative.   Cardiovascular: Positive for chest pain. Negative for palpitations and leg swelling.  Gastrointestinal: Negative.        Objective:   Physical Exam  Constitutional: He appears well-developed and well-nourished. No distress.  Cardiovascular: Normal rate, regular rhythm, normal heart sounds and intact distal pulses.   Pulmonary/Chest: Effort normal and breath sounds normal. No respiratory distress. He has no wheezes. He has no rales.  There is an area of ecchymosis on the left lateral lower ribs. This area is slightly tender, no crepitus           Assessment & Plan:  Bruised ribs. Use heat and Advil prn.

## 2014-08-16 NOTE — Progress Notes (Signed)
Pre visit review using our clinic review tool, if applicable. No additional management support is needed unless otherwise documented below in the visit note. 

## 2014-09-01 ENCOUNTER — Telehealth: Payer: Self-pay | Admitting: Family Medicine

## 2014-09-01 ENCOUNTER — Ambulatory Visit (INDEPENDENT_AMBULATORY_CARE_PROVIDER_SITE_OTHER): Payer: Medicare Other | Admitting: Family Medicine

## 2014-09-01 ENCOUNTER — Encounter: Payer: Self-pay | Admitting: Family Medicine

## 2014-09-01 VITALS — BP 176/81 | HR 64 | Ht 70.0 in | Wt 182.0 lb

## 2014-09-01 DIAGNOSIS — L859 Epidermal thickening, unspecified: Secondary | ICD-10-CM | POA: Diagnosis not present

## 2014-09-01 DIAGNOSIS — I10 Essential (primary) hypertension: Secondary | ICD-10-CM

## 2014-09-01 MED ORDER — LOSARTAN POTASSIUM-HCTZ 50-12.5 MG PO TABS: 1.0000 | ORAL_TABLET | Freq: Every day | ORAL | Status: DC

## 2014-09-01 MED ORDER — NEBIVOLOL HCL 10 MG PO TABS: 10.0000 mg | ORAL_TABLET | Freq: Two times a day (BID) | ORAL | Status: DC

## 2014-09-01 MED ORDER — NEBIVOLOL HCL 10 MG PO TABS: 10.0000 mg | ORAL_TABLET | Freq: Every day | ORAL | Status: DC

## 2014-09-01 MED ORDER — AMMONIUM LACTATE 12 % EX CREA: TOPICAL_CREAM | Freq: Two times a day (BID) | CUTANEOUS | Status: DC

## 2014-09-01 NOTE — Progress Notes (Signed)
   Subjective:    Patient ID: Travis Ware, male    DOB: Oct 25, 1929, 81 y.o.   MRN: [id number]  HPI Here to check his BP and to ask about several spots of crusty skin on the left foot. He feels fine in general. The skin spots came up about a month ago.    Review of Systems  Constitutional: Negative.   Respiratory: Negative.   Cardiovascular: Negative.        Objective:   Physical Exam  Constitutional: He appears well-developed and well-nourished.  Cardiovascular: Normal rate, regular rhythm, normal heart sounds and intact distal pulses.   Pulmonary/Chest: Effort normal and breath sounds normal.  Skin:  The left foot has 2 areas of inflamed crusty skin which is quite hyperkeratotic           Assessment & Plan:  Refilled both BP meds. Try Lac-Hydrin cream for the skin.

## 2014-09-01 NOTE — Progress Notes (Signed)
Pre visit review using our clinic review tool, if applicable. No additional management support is needed unless otherwise documented below in the visit note. 

## 2014-09-01 NOTE — Telephone Encounter (Signed)
emmi mailed  °

## 2014-09-06 ENCOUNTER — Telehealth: Payer: Self-pay | Admitting: Family Medicine

## 2014-09-06 NOTE — Telephone Encounter (Signed)
Switch from Bystolic to Metoprolol succinate 50 mg to take daily. Call in one year supply

## 2014-09-06 NOTE — Telephone Encounter (Signed)
Bystolic is not on formulary, can we change script and resend to [facility]?

## 2014-09-07 MED ORDER — METOPROLOL SUCCINATE ER 50 MG PO TB24: 50.0000 mg | ORAL_TABLET | Freq: Every day | ORAL | Status: DC

## 2014-09-07 NOTE — Telephone Encounter (Signed)
rx sent in electronically 

## 2014-11-10 DIAGNOSIS — L0103 Bullous impetigo: Secondary | ICD-10-CM | POA: Diagnosis not present

## 2014-11-10 DIAGNOSIS — L859 Epidermal thickening, unspecified: Secondary | ICD-10-CM | POA: Diagnosis not present

## 2014-11-10 DIAGNOSIS — R609 Edema, unspecified: Secondary | ICD-10-CM | POA: Diagnosis not present

## 2014-11-12 ENCOUNTER — Encounter: Payer: Self-pay | Admitting: Family Medicine

## 2014-11-12 ENCOUNTER — Telehealth: Payer: Self-pay | Admitting: Family Medicine

## 2014-11-12 ENCOUNTER — Ambulatory Visit (INDEPENDENT_AMBULATORY_CARE_PROVIDER_SITE_OTHER)
Admission: RE | Admit: 2014-11-12 | Discharge: 2014-11-12 | Disposition: A | Discharge status: Home / Self Care | Payer: Medicare Other | Source: Ambulatory Visit | Attending: Family Medicine | Admitting: Family Medicine

## 2014-11-12 ENCOUNTER — Ambulatory Visit (INDEPENDENT_AMBULATORY_CARE_PROVIDER_SITE_OTHER): Payer: Medicare Other | Admitting: Family Medicine

## 2014-11-12 VITALS — BP 159/75 | HR 67 | Temp 98.3°F | Ht 70.0 in | Wt 170.0 lb

## 2014-11-12 DIAGNOSIS — R05 Cough: Secondary | ICD-10-CM

## 2014-11-12 DIAGNOSIS — R059 Cough, unspecified: Secondary | ICD-10-CM

## 2014-11-12 DIAGNOSIS — L03116 Cellulitis of left lower limb: Secondary | ICD-10-CM

## 2014-11-12 DIAGNOSIS — R413 Other amnesia: Secondary | ICD-10-CM

## 2014-11-12 MED ORDER — MUPIROCIN CALCIUM 2 % EX CREA
1.0000 | TOPICAL_CREAM | Freq: Every day | CUTANEOUS | Status: DC
Start: 2014-11-12 — End: 2015-03-29

## 2014-11-12 NOTE — Telephone Encounter (Signed)
Pt daughter is calling requesting dr fry to call her mother prior to Bari appt today. Pt daughter would not elaborate just said it is urgent. I offered team health it was decline

## 2014-11-12 NOTE — Telephone Encounter (Signed)
Pt is here now for office visit.  

## 2014-11-12 NOTE — Progress Notes (Signed)
   Subjective:    Patient ID: Travis Ware, male    DOB: 08-02-1929, 81 y.o.   MRN: [id number]  HPI Here with his daughter who is visiting from [place], [place] to check a lesion on the right lower leg. This has been present about a month, and it has been lowly enlarging. It sometimes drains clear fluid but it is not painful. He saw Dr. Jari Pigg for tis 2 days ago and she prescribed dressings with Mupiricin and gauze TID. However he has apparently lost the rx and he still as the original dressing on. As noted in is chart, his family and I have been very concerned about is mental capacity, and I feel he has rapidly advancing dementia. He saw Dr. Delice Lesch last fall, and she showed significant problems with memory and cognition. She ordered an MRI scan of the brain, but Travis Ware never actually set this up and of course never had the scan. The family says they have seen a rapid decline in his mental status just in the last month or so. Travis Ware denies having any problems as usual. In addition his daughter noted he has had a dry cough for the past week. No fever or any other URI sx.    Review of Systems  Constitutional: Negative.   Respiratory: Positive for cough. Negative for apnea, choking, chest tightness, shortness of breath, wheezing and stridor.   Cardiovascular: Negative.   Neurological: Negative.        Objective:   Physical Exam  Constitutional: He appears well-developed and well-nourished. No distress.  Cardiovascular: Normal rate, regular rhythm, normal heart sounds and intact distal pulses.   Pulmonary/Chest: Effort normal. No respiratory distress.  Scattered rhonchi and soft wheezes. There are also rales present in the left posterior base   Neurological: He is alert.  Skin:  The lower left leg has an area of erythema and scaling which is macerated but not tender           Assessment & Plan:  He has an area of severe eczema that as become infected. He will cleanse it with soap and  water in the shower daily, then dress it daily with Mupiricn and gauze. I agree with the family that he has declined significantly since I last saw him. We will get him back in next week for a more detailed exam and labs. We will also send him for a CXR today.

## 2014-11-15 ENCOUNTER — Telehealth: Payer: Self-pay | Admitting: Family Medicine

## 2014-11-15 NOTE — Telephone Encounter (Signed)
Pt call to ask what they need to do to have someone come out and change his dressing . Pt has staph infection in his right foot .

## 2014-11-15 NOTE — Telephone Encounter (Addendum)
Wife would like results of lung xray last Friday.  Also, wife is looking for the letter dr fry was going to write about pt's memory issues.  She just really wants to speak w/ dr fry.

## 2014-11-15 NOTE — Telephone Encounter (Signed)
I spoke with Travis Ware and she can be reached either on her cell number or [phone], she is going to be out of town for a couple of days and I did give results.

## 2014-11-15 NOTE — Telephone Encounter (Signed)
Sunday Spillers this was not address.

## 2014-11-16 MED ORDER — CEPHALEXIN 500 MG PO CAPS: 500.0000 mg | ORAL_CAPSULE | Freq: Four times a day (QID) | ORAL | Status: DC

## 2014-11-16 NOTE — Telephone Encounter (Signed)
Call in Keflex 500 mg QID, #40 and also please put in a home health order for nursing visits to change dressings daily on his lower leg

## 2014-11-16 NOTE — Telephone Encounter (Signed)
Pt stopped by office and was given the below information.

## 2014-11-16 NOTE — Telephone Encounter (Signed)
I did get Arbie Cookey on the phone and [name] did speak with her.

## 2014-11-16 NOTE — Telephone Encounter (Signed)
I sent script e-scribe to [facility]. Also spoke with [facility] intake at [phone], faxed paperwork to [phone].

## 2014-11-16 NOTE — Telephone Encounter (Signed)
We will give Arbie Cookey a call

## 2014-11-22 ENCOUNTER — Telehealth: Payer: Self-pay

## 2014-11-22 NOTE — Telephone Encounter (Signed)
Pt walked into office. Complaining of right ankle wound. Pt states there is some swelling, no pain, sore. Pt states this happened around 2 weeks ago and it is not healing. Informed [name] nurse and she spoke with [name]. [name] stated that patient could wait to be seen on 11/23/14. Informed patient and he was okay with being seen 11/23/14. Pt vitals: BP 126/74  Pulse: 64  Temp: 97.7

## 2014-11-23 ENCOUNTER — Ambulatory Visit: Payer: Medicare Other | Admitting: Family Medicine

## 2014-11-24 ENCOUNTER — Encounter: Payer: Self-pay | Admitting: Family Medicine

## 2014-11-24 ENCOUNTER — Ambulatory Visit (INDEPENDENT_AMBULATORY_CARE_PROVIDER_SITE_OTHER): Payer: Medicare Other | Admitting: Family Medicine

## 2014-11-24 ENCOUNTER — Ambulatory Visit: Payer: Medicare Other | Admitting: Family Medicine

## 2014-11-24 VITALS — BP 174/82 | HR 64 | Temp 97.7°F | Ht 70.0 in | Wt 171.0 lb

## 2014-11-24 DIAGNOSIS — L03115 Cellulitis of right lower limb: Secondary | ICD-10-CM | POA: Diagnosis not present

## 2014-11-24 MED ORDER — LEVOFLOXACIN 500 MG PO TABS: 500.0000 mg | ORAL_TABLET | Freq: Every day | ORAL | Status: AC

## 2014-11-24 NOTE — Progress Notes (Signed)
Pre visit review using our clinic review tool, if applicable. No additional management support is needed unless otherwise documented below in the visit note. 

## 2014-11-24 NOTE — Progress Notes (Signed)
   Subjective:    Patient ID: Travis Ware, male    DOB: [birth year], 81 y.o.   MRN: [id number]  HPI Here to recheck an area of cellulitis on the right lower leg. He was here a few weeks ago and we started him on a regimen of Keflex and wrapping the leg with Mupiricin and gauze. He did this for about a week and he says the skin was clearing up nicely. However he then stopped the treatments so now the leg has worsened again. No fever.    Review of Systems  Constitutional: Negative.   Skin: Positive for rash.       Objective:   Physical Exam  Constitutional: He appears well-developed and well-nourished.  Skin:  The right lower leg has an area of red, scaly, warm skin. No drainage. This has improved since hie last exam here.           Assessment & Plan:  He will resume wrapping the leg with Mupiricin cream and gauze once daily. Start on 14 days of Levaquin.

## 2014-11-25 ENCOUNTER — Ambulatory Visit: Payer: Medicare Other | Admitting: Family Medicine

## 2014-11-26 ENCOUNTER — Telehealth: Payer: Self-pay | Admitting: Neurology

## 2014-11-26 NOTE — Telephone Encounter (Signed)
Returned call. Left msg on vm that due to not having a hippa release on file for her that we wouldn't be able to speak with her about Travis Ware.

## 2014-11-26 NOTE — Telephone Encounter (Signed)
Pt's daughter Elmyra Ricks called and wanted to get an MRI scheduled for her dad/Dawn CB# [phone]

## 2014-11-30 ENCOUNTER — Encounter: Payer: Self-pay | Admitting: Family Medicine

## 2014-11-30 ENCOUNTER — Ambulatory Visit (INDEPENDENT_AMBULATORY_CARE_PROVIDER_SITE_OTHER): Payer: Medicare Other | Admitting: Family Medicine

## 2014-11-30 VITALS — BP 164/83 | HR 67 | Temp 97.7°F | Ht 70.0 in | Wt 172.0 lb

## 2014-11-30 DIAGNOSIS — L309 Dermatitis, unspecified: Secondary | ICD-10-CM

## 2014-11-30 DIAGNOSIS — L03115 Cellulitis of right lower limb: Secondary | ICD-10-CM | POA: Diagnosis not present

## 2014-11-30 NOTE — Progress Notes (Signed)
   Subjective:    Patient ID: Travis Ware, male    DOB: Feb 20, 1930, 81 y.o.   MRN: [id number]  HPI Here to recheck an area of cellulitis on an eczema patch on the right lower leg. We saw him on 11-24-14 and prescribed dressings with Mupiricin daily and Levaquin. He is confused today and is not sure if he has been doing this or not. It sounds like he never picked these up from the pharmacy. The leg feels better to him though and he has no discomfort.    Review of Systems  Constitutional: Negative.   Skin: Positive for wound.       Objective:   Physical Exam  Constitutional: He appears well-developed and well-nourished.  Skin:  The area of skin on the right lower leg looks much improved over last time. It is not tender and the erythema is gone. He has some scaling and a tiny bit of serous drainage.           Assessment & Plan:  The cellulitis has improved quite a bit. Continue  the daily dressing changes. I encouraged him to go get the Levaquin and to take this as prescribed. Recheck prn

## 2014-11-30 NOTE — Progress Notes (Signed)
Pre visit review using our clinic review tool, if applicable. No additional management support is needed unless otherwise documented below in the visit note. 

## 2014-12-06 ENCOUNTER — Encounter: Payer: Self-pay | Admitting: Family Medicine

## 2014-12-06 ENCOUNTER — Ambulatory Visit (INDEPENDENT_AMBULATORY_CARE_PROVIDER_SITE_OTHER): Payer: Medicare Other | Admitting: Family Medicine

## 2014-12-06 VITALS — BP 176/81 | HR 68 | Temp 98.2°F | Ht 70.0 in | Wt 169.0 lb

## 2014-12-06 DIAGNOSIS — L03115 Cellulitis of right lower limb: Secondary | ICD-10-CM

## 2014-12-06 DIAGNOSIS — K645 Perianal venous thrombosis: Secondary | ICD-10-CM

## 2014-12-06 NOTE — Progress Notes (Signed)
   Subjective:    Patient ID: Travis Ware Eye, male    DOB: 04-26-1930, 81 y.o.   MRN: [id number]  HPI Here for 3 days of a swollen tender lump around the anus. No bleeding. BMs are regular. Also he continues to dress the lesion on the right lower leg daily and it is slowly improving.    Review of Systems  Constitutional: Negative.   Gastrointestinal: Positive for rectal pain. Negative for nausea, vomiting, abdominal pain, diarrhea, blood in stool, abdominal distention and anal bleeding.  Skin: Positive for wound.       Objective:   Physical Exam  Constitutional: He appears well-developed and well-nourished.  Cardiovascular: Normal rate, regular rhythm, normal heart sounds and intact distal pulses.   Pulmonary/Chest: Effort normal and breath sounds normal.  Abdominal: Soft. Bowel sounds are normal. He exhibits no distension and no mass. There is no tenderness. There is no rebound and no guarding.  Genitourinary:  Single large thrombosed external hemorrhoid  Skin:  The area on the lower right leg is improving with less erythema          Assessment & Plan:  The hemorrhoid was lanced with a scalpel and the thrombus was evacuated. The lesion on the leg was redressed with Silvadene and gauze.

## 2014-12-06 NOTE — Progress Notes (Signed)
Pre visit review using our clinic review tool, if applicable. No additional management support is needed unless otherwise documented below in the visit note. 

## 2014-12-08 ENCOUNTER — Telehealth: Payer: Self-pay | Admitting: Family Medicine

## 2014-12-08 NOTE — Telephone Encounter (Signed)
Per [name] okay to go over with Arbie Cookey the last office visit and I did speak with her about this.

## 2014-12-08 NOTE — Telephone Encounter (Addendum)
Pt wife would like to know how her husband visit on 7-11 went. Wife is on [place]

## 2014-12-09 ENCOUNTER — Telehealth: Payer: Self-pay | Admitting: *Deleted

## 2014-12-09 NOTE — Telephone Encounter (Signed)
Patient walked into the office complaining of spotting bleeding after having a hemorrhoid lanced.  The blood is red and he denies any pain.  Patient is going out of town tomorrow. vitals  BP 120/80 P 64 T 98.3 Per Dr Sarajane Jews patient should try OTC Preparation H.  Patient agreed.

## 2014-12-20 DIAGNOSIS — H2513 Age-related nuclear cataract, bilateral: Secondary | ICD-10-CM | POA: Diagnosis not present

## 2014-12-21 ENCOUNTER — Telehealth: Payer: Self-pay | Admitting: Family Medicine

## 2014-12-21 NOTE — Telephone Encounter (Signed)
Give her a copy of the Neurology note from 03-12-14

## 2014-12-21 NOTE — Telephone Encounter (Signed)
Wife is needing the folsteen test scores, (mini mental) from the other week They state pt is going to hill rapidly and they need to try and get help in there asap.  Pt is not showering, wearing the same clothes, driving, and generally not safe.  Please try wife's cell phone first and the other number is the daughter phone.  Daughter in town until weekend

## 2014-12-22 ENCOUNTER — Encounter: Payer: Self-pay | Admitting: Family Medicine

## 2014-12-22 ENCOUNTER — Ambulatory Visit (INDEPENDENT_AMBULATORY_CARE_PROVIDER_SITE_OTHER): Payer: Medicare Other | Admitting: Family Medicine

## 2014-12-22 VITALS — BP 181/95 | HR 60 | Temp 98.2°F

## 2014-12-22 DIAGNOSIS — L03115 Cellulitis of right lower limb: Secondary | ICD-10-CM | POA: Diagnosis not present

## 2014-12-22 MED ORDER — LEVOFLOXACIN 500 MG PO TABS: 500.0000 mg | ORAL_TABLET | Freq: Every day | ORAL | Status: AC

## 2014-12-22 NOTE — Progress Notes (Signed)
Pre visit review using our clinic review tool, if applicable. No additional management support is needed unless otherwise documented below in the visit note. 

## 2014-12-22 NOTE — Progress Notes (Signed)
   Subjective:    Patient ID: Travis Ware, male    DOB: 1929/07/24, 81 y.o.   MRN: [id number]  HPI Here to recheck cellulitis on the right lower leg. We had been treating this with daily wraps of Silvadene and gauze and it had been improving. However in the past few days he says it has gotten more red, is draining more and is more painful. No fever.    Review of Systems  Constitutional: Negative.   Skin: Positive for rash.       Objective:   Physical Exam  Constitutional: He appears well-developed and well-nourished.  Skin:  Area on the right lower leg which is scaly, red, oozing clear fluid and is tender           Assessment & Plan:  The cellulitis has flared up again so we will cover with 14 days of Levaquin. Recheck prn

## 2014-12-23 ENCOUNTER — Telehealth: Payer: Self-pay | Admitting: Family Medicine

## 2014-12-23 NOTE — Telephone Encounter (Signed)
Pt was seen on 7-27 and pt wife would like her husband to have another mini mental test. Pt last mini mental test was in sept 2015. Pt wife would like sylvia to return her call

## 2014-12-23 NOTE — Telephone Encounter (Signed)
I printed this and spoke with Arbie Cookey, she will pick up today.

## 2014-12-24 ENCOUNTER — Telehealth: Payer: Self-pay | Admitting: Family Medicine

## 2014-12-24 NOTE — Telephone Encounter (Signed)
I left a voice message on Travis Ware's cell number with below message.

## 2014-12-24 NOTE — Telephone Encounter (Signed)
This is a good idea, but he really needs to get this at the Neurologist's office. He  needs to see Dr. Delice Lesch again soon anyway.

## 2014-12-24 NOTE — Telephone Encounter (Signed)
Pt called to make appt w/ dr fry for tomorrow. Advise pt this is Friday and dr fry is out of offcie til Monday Pt states he foot infection looks worse, crusty stuff all over it and thinks he needs to be seen. Per DR Sarajane Jews, pt should go to the ED  Pt had questions. Transferred to sylvia

## 2014-12-25 ENCOUNTER — Ambulatory Visit: Payer: Medicare Other | Admitting: Family Medicine

## 2014-12-27 NOTE — Telephone Encounter (Signed)
I spoke with pt Friday afternoon around 5 pm ( 12/24/14 ) and advised pt to go to ER, pt agreed.

## 2014-12-28 ENCOUNTER — Telehealth: Payer: Self-pay | Admitting: Family Medicine

## 2014-12-28 NOTE — Telephone Encounter (Signed)
Such a letter was written and his wife can pick it up

## 2014-12-28 NOTE — Telephone Encounter (Signed)
Travis Ware is requesting a letter stating that pt should not be driving due to safety reasons, she and family will attach it along with their letter or recommendation.

## 2014-12-28 NOTE — Telephone Encounter (Signed)
I spoke with Carol.

## 2014-12-30 ENCOUNTER — Ambulatory Visit: Payer: Medicare Other | Admitting: Family Medicine

## 2014-12-30 DIAGNOSIS — I8310 Varicose veins of unspecified lower extremity with inflammation: Secondary | ICD-10-CM | POA: Diagnosis not present

## 2014-12-30 DIAGNOSIS — I8311 Varicose veins of right lower extremity with inflammation: Secondary | ICD-10-CM | POA: Diagnosis not present

## 2014-12-30 DIAGNOSIS — L0103 Bullous impetigo: Secondary | ICD-10-CM | POA: Diagnosis not present

## 2014-12-30 DIAGNOSIS — L859 Epidermal thickening, unspecified: Secondary | ICD-10-CM | POA: Diagnosis not present

## 2015-01-03 ENCOUNTER — Telehealth: Payer: Self-pay | Admitting: Family Medicine

## 2015-01-03 NOTE — Telephone Encounter (Signed)
Pt walked into the office today for ongoing foot issues.  Has been followed by dermatology for this issue.  [name] (caretaker) reports significant pain over the weekend.  Pt barely able to walk.  She stated redness was worse.  Today is better.  Pt not in distress.  Looked at the wound.  Was little redness, no puss or swelling.  Did have some cracking in the skin but nothing new.  Pt currently on Keflex 500 mg bid X 10 Days and mupirocin 2% ointment bid X 7 Days prescribed by dermatology.  Called dermatology and made an appt with Dr. Delman Cheadle on 01/04/2015 at 10:30 AM.  [name] and pt notified of appointment.  Advised that they call dermatology with other questions or concerns as they are now treating the wound.  Also advised to complete antibiotics.  [name] also asked about BP today.  BP is 154/80 (down from the last several visits).  Informed her that the bp is better than in the past.  Advised a home machine and that pain can increase bp.  Could be from the leg.  However, if noticing that bp remains high than should call and be seen by [name].  Both understand to be seen if bp remains high.  Pt released.  BP:  154/80 Temp: 97.9 Temporal HR: 62 O2: 62

## 2015-01-03 NOTE — Telephone Encounter (Signed)
O2: 97 (not 62)

## 2015-01-04 ENCOUNTER — Telehealth: Payer: Self-pay | Admitting: *Deleted

## 2015-01-04 DIAGNOSIS — A491 Streptococcal infection, unspecified site: Secondary | ICD-10-CM | POA: Diagnosis not present

## 2015-01-04 DIAGNOSIS — L859 Epidermal thickening, unspecified: Secondary | ICD-10-CM | POA: Diagnosis not present

## 2015-01-04 NOTE — Telephone Encounter (Signed)
Patient walked into office with concern around which BP medication to take (currently prescribed HCTZ and metoprolol). Spoke with MD Yong Channel and he advised patient to only take HCTZ between now and Thursday and to record his HR and BP. He then asked patient's home nurse to call office with patient's reading and that will determine when patient will begin metoprolol until MD Sarajane Jews returns. Patient and home health nurse verbalized understanding.

## 2015-01-07 NOTE — Telephone Encounter (Signed)
Please follow-up with patient Monday about BP and what MD Sarajane Jews advises about medication regimen regarding both medications

## 2015-01-17 ENCOUNTER — Telehealth: Payer: Self-pay | Admitting: Family Medicine

## 2015-01-17 NOTE — Telephone Encounter (Signed)
Pt wife is calling requesting a med for her husband to take due to getting agitated. Pt needs something to take edge off . Pt has dementia [place]

## 2015-01-17 NOTE — Telephone Encounter (Signed)
Call in Ativan 1 mg to take every 6 hours prn anxiety, #60 with 2 rf

## 2015-01-18 MED ORDER — LORAZEPAM 1 MG PO TABS: 1.0000 mg | ORAL_TABLET | Freq: Four times a day (QID) | ORAL | Status: DC | PRN

## 2015-01-18 NOTE — Telephone Encounter (Signed)
I called in script and left a voice message for Arbie Cookey.

## 2015-01-18 NOTE — Telephone Encounter (Signed)
Mrs Henslee calling back to fu on med request.  Will you call this in for pt? Thank you!

## 2015-01-19 DIAGNOSIS — S80862A Insect bite (nonvenomous), left lower leg, initial encounter: Secondary | ICD-10-CM | POA: Diagnosis not present

## 2015-01-19 DIAGNOSIS — L859 Epidermal thickening, unspecified: Secondary | ICD-10-CM | POA: Diagnosis not present

## 2015-01-19 DIAGNOSIS — L0103 Bullous impetigo: Secondary | ICD-10-CM | POA: Diagnosis not present

## 2015-01-19 DIAGNOSIS — W57XXXA Bitten or stung by nonvenomous insect and other nonvenomous arthropods, initial encounter: Secondary | ICD-10-CM | POA: Diagnosis not present

## 2015-02-02 DIAGNOSIS — H25811 Combined forms of age-related cataract, right eye: Secondary | ICD-10-CM | POA: Diagnosis not present

## 2015-02-02 DIAGNOSIS — H2512 Age-related nuclear cataract, left eye: Secondary | ICD-10-CM | POA: Diagnosis not present

## 2015-02-11 DIAGNOSIS — I8311 Varicose veins of right lower extremity with inflammation: Secondary | ICD-10-CM | POA: Diagnosis not present

## 2015-02-11 DIAGNOSIS — L0103 Bullous impetigo: Secondary | ICD-10-CM | POA: Diagnosis not present

## 2015-02-14 ENCOUNTER — Encounter: Payer: Self-pay | Admitting: Family Medicine

## 2015-02-14 ENCOUNTER — Ambulatory Visit (INDEPENDENT_AMBULATORY_CARE_PROVIDER_SITE_OTHER): Payer: Medicare Other | Admitting: Family Medicine

## 2015-02-14 VITALS — BP 152/69 | HR 62 | Temp 97.6°F | Ht 70.0 in | Wt 173.0 lb

## 2015-02-14 DIAGNOSIS — Z23 Encounter for immunization: Secondary | ICD-10-CM | POA: Diagnosis not present

## 2015-02-14 DIAGNOSIS — R609 Edema, unspecified: Secondary | ICD-10-CM

## 2015-02-14 DIAGNOSIS — N401 Enlarged prostate with lower urinary tract symptoms: Secondary | ICD-10-CM | POA: Diagnosis not present

## 2015-02-14 DIAGNOSIS — R413 Other amnesia: Secondary | ICD-10-CM | POA: Diagnosis not present

## 2015-02-14 DIAGNOSIS — K219 Gastro-esophageal reflux disease without esophagitis: Secondary | ICD-10-CM

## 2015-02-14 DIAGNOSIS — N138 Other obstructive and reflux uropathy: Secondary | ICD-10-CM

## 2015-02-14 DIAGNOSIS — E0590 Thyrotoxicosis, unspecified without thyrotoxic crisis or storm: Secondary | ICD-10-CM

## 2015-02-14 DIAGNOSIS — R7989 Other specified abnormal findings of blood chemistry: Secondary | ICD-10-CM

## 2015-02-14 DIAGNOSIS — I10 Essential (primary) hypertension: Secondary | ICD-10-CM

## 2015-02-14 LAB — CBC WITH DIFFERENTIAL/PLATELET
BASOS PCT: 0.4 % (ref 0.0–3.0)
Basophils Absolute: 0 10*3/uL (ref 0.0–0.1)
EOS ABS: 0.2 10*3/uL (ref 0.0–0.7)
EOS PCT: 2.6 % (ref 0.0–5.0)
HEMATOCRIT: 51.1 % (ref 39.0–52.0)
Hemoglobin: 17.2 g/dL — ABNORMAL HIGH (ref 13.0–17.0)
LYMPHS PCT: 15.9 % (ref 12.0–46.0)
Lymphs Abs: 1.2 10*3/uL (ref 0.7–4.0)
MCHC: 33.6 g/dL (ref 30.0–36.0)
MCV: 92.4 fl (ref 78.0–100.0)
Monocytes Absolute: 0.9 10*3/uL (ref 0.1–1.0)
Monocytes Relative: 11.1 % (ref 3.0–12.0)
NEUTROS ABS: 5.5 10*3/uL (ref 1.4–7.7)
Neutrophils Relative %: 70 % (ref 43.0–77.0)
PLATELETS: 211 10*3/uL (ref 150.0–400.0)
RBC: 5.53 Mil/uL (ref 4.22–5.81)
RDW: 14.4 % (ref 11.5–15.5)
WBC: 7.9 10*3/uL (ref 4.0–10.5)

## 2015-02-14 LAB — HEPATIC FUNCTION PANEL
ALK PHOS: 79 U/L (ref 39–117)
ALT: 13 U/L (ref 0–53)
AST: 16 U/L (ref 0–37)
Albumin: 4.1 g/dL (ref 3.5–5.2)
BILIRUBIN DIRECT: 0.2 mg/dL (ref 0.0–0.3)
BILIRUBIN TOTAL: 0.9 mg/dL (ref 0.2–1.2)
TOTAL PROTEIN: 6.8 g/dL (ref 6.0–8.3)

## 2015-02-14 LAB — BASIC METABOLIC PANEL
BUN: 14 mg/dL (ref 6–23)
CHLORIDE: 100 meq/L (ref 96–112)
CO2: 34 mEq/L — ABNORMAL HIGH (ref 19–32)
Calcium: 9.6 mg/dL (ref 8.4–10.5)
Creatinine, Ser: 0.82 mg/dL (ref 0.40–1.50)
GFR: 95.23 mL/min (ref >60.00)
GLUCOSE: 100 mg/dL — AB (ref 70–99)
POTASSIUM: 4.7 meq/L (ref 3.5–5.1)
Sodium: 141 mEq/L (ref 135–145)

## 2015-02-14 LAB — LIPID PANEL
CHOLESTEROL: 181 mg/dL (ref 0–200)
HDL: 62.4 mg/dL (ref >39.00)
LDL Cholesterol: 106 mg/dL — ABNORMAL HIGH (ref 0–99)
NonHDL: 118.16
TRIGLYCERIDES: 61 mg/dL (ref 0.0–149.0)
Total CHOL/HDL Ratio: 3
VLDL: 12.2 mg/dL (ref 0.0–40.0)

## 2015-02-14 LAB — POCT URINALYSIS DIPSTICK
BILIRUBIN UA: NEGATIVE
Blood, UA: NEGATIVE
GLUCOSE UA: NEGATIVE
Ketones, UA: NEGATIVE
LEUKOCYTES UA: NEGATIVE
NITRITE UA: NEGATIVE
Protein, UA: NEGATIVE
Spec Grav, UA: >=1.03
Urobilinogen, UA: 0.2
pH, UA: 6

## 2015-02-14 LAB — PSA: PSA: 1.42 ng/mL (ref 0.10–4.00)

## 2015-02-14 LAB — TSH: TSH: 0.06 u[IU]/mL — ABNORMAL LOW (ref 0.35–4.50)

## 2015-02-14 NOTE — Addendum Note (Signed)
Addended by: Aggie Hacker A on: 02/14/2015 10:24 AM   Modules accepted: Orders

## 2015-02-14 NOTE — Progress Notes (Signed)
   Subjective:    Patient ID: Travis Ware, male    DOB: 09-Jul-1929, 81 y.o.   MRN: [id number]  HPI 81 yr old male for follow of several issues. His BP has been running a little high, but this seems to be the result of often not taking all his meds. He is here today with [name], his live in nursing aide. He always takes the Losartan HCT in the mornings, but he often skips the Metoprolol in the evenings because he usually has an afternoon alcoholic drink. They were not sure if it would be okay to take the BP med with alcohol or not. He feels well and has no concerns. [name] says he eats well. His memory is still an issue, but it has been very helpful for him to have her around. The area of cellulitis on the right foot has improved a lot.    Review of Systems  Constitutional: Negative.   HENT: Negative.   Eyes: Negative.   Respiratory: Negative.   Cardiovascular: Negative.   Gastrointestinal: Negative.   Genitourinary: Negative.   Musculoskeletal: Negative.   Skin: Negative.   Neurological: Negative.   Psychiatric/Behavioral: Negative.        Objective:   Physical Exam  Constitutional: He is oriented to person, place, and time. He appears well-developed and well-nourished. No distress.  HENT:  Head: Normocephalic and atraumatic.  Right Ear: External ear normal.  Left Ear: External ear normal.  Nose: Nose normal.  Mouth/Throat: Oropharynx is clear and moist. No oropharyngeal exudate.  Eyes: Conjunctivae and EOM are normal. Pupils are equal, round, and reactive to light. Right eye exhibits no discharge. Left eye exhibits no discharge. No scleral icterus.  Neck: Neck supple. No JVD present. No tracheal deviation present. No thyromegaly present.  Cardiovascular: Normal rate, regular rhythm, normal heart sounds and intact distal pulses.  Exam reveals no gallop and no friction rub.   No murmur heard. EKG normal   Pulmonary/Chest: Effort normal and breath sounds normal. No respiratory  distress. He has no wheezes. He has no rales. He exhibits no tenderness.  Abdominal: Soft. Bowel sounds are normal. He exhibits no distension and no mass. There is no tenderness. There is no rebound and no guarding.  Genitourinary: Rectum normal, prostate normal and penis normal. Guaiac negative stool. No penile tenderness.  Musculoskeletal: Normal range of motion. He exhibits no edema or tenderness.  Lymphadenopathy:    He has no cervical adenopathy.  Neurological: He is alert and oriented to person, place, and time. He has normal reflexes. No cranial nerve deficit. He exhibits normal muscle tone. Coordination normal.  Skin: Skin is warm and dry. No rash noted. He is not diaphoretic. No erythema. No pallor.  Psychiatric: He has a normal mood and affect. His behavior is normal. Judgment and thought content normal.          Assessment & Plan:  He is doing well in general, and having an aide around has been a great help. We discussed diet and exercise advice. I advised him to always take his BP meds even if  He has alcohol to drink. His cellulitis has improved. Get fasting labs.

## 2015-02-14 NOTE — Progress Notes (Signed)
Pre visit review using our clinic review tool, if applicable. No additional management support is needed unless otherwise documented below in the visit note. 

## 2015-02-16 NOTE — Addendum Note (Signed)
Addended by: Alysia Penna A on: 02/16/2015 09:14 AM   Modules accepted: Orders

## 2015-02-21 ENCOUNTER — Other Ambulatory Visit (INDEPENDENT_AMBULATORY_CARE_PROVIDER_SITE_OTHER): Payer: Medicare Other

## 2015-02-21 DIAGNOSIS — E0590 Thyrotoxicosis, unspecified without thyrotoxic crisis or storm: Secondary | ICD-10-CM

## 2015-02-21 DIAGNOSIS — R7989 Other specified abnormal findings of blood chemistry: Secondary | ICD-10-CM | POA: Diagnosis not present

## 2015-02-21 LAB — T3, FREE: T3, Free: 3.2 pg/mL (ref 2.3–4.2)

## 2015-02-21 LAB — T4, FREE: Free T4: 1.29 ng/dL (ref 0.60–1.60)

## 2015-02-21 LAB — TSH: TSH: 0.08 u[IU]/mL — AB (ref 0.35–4.50)

## 2015-03-03 ENCOUNTER — Ambulatory Visit (INDEPENDENT_AMBULATORY_CARE_PROVIDER_SITE_OTHER): Payer: Medicare Other | Admitting: Family Medicine

## 2015-03-03 ENCOUNTER — Ambulatory Visit: Payer: Medicare Other | Admitting: Family Medicine

## 2015-03-03 ENCOUNTER — Encounter: Payer: Self-pay | Admitting: Family Medicine

## 2015-03-03 VITALS — BP 144/71 | HR 51 | Temp 97.6°F | Ht 70.0 in | Wt 173.0 lb

## 2015-03-03 DIAGNOSIS — R413 Other amnesia: Secondary | ICD-10-CM | POA: Diagnosis not present

## 2015-03-03 DIAGNOSIS — I10 Essential (primary) hypertension: Secondary | ICD-10-CM | POA: Diagnosis not present

## 2015-03-03 NOTE — Progress Notes (Signed)
Pre visit review using our clinic review tool, if applicable. No additional management support is needed unless otherwise documented below in the visit note. 

## 2015-03-03 NOTE — Progress Notes (Signed)
   Subjective:    Patient ID: Travis Ware, male    DOB: 04/17/1930, 81 y.o.   MRN: [id number]  HPI Here to discuss his driving abilities and to recheck BP. He feels fine and his BP seems to be stable. He drove himself here today. We have recentlly filled out a DMV form in which I certiified that I do not feel he is safe to drive anymore. I came to this decision because of his advancing dementia, his memory issues, his periods of confusion, and his decreased reaction time. He admits today that his thinking is slower than it used to be and that he does get confused at times. He does not feel he is unsafe to drive at this time.    Review of Systems  Constitutional: Negative.   Respiratory: Negative.   Cardiovascular: Negative.   Neurological: Negative.   Psychiatric/Behavioral: Positive for confusion and decreased concentration. Negative for behavioral problems and agitation.       Objective:   Physical Exam  Constitutional: He appears well-developed and well-nourished.  Cardiovascular: Normal rate, regular rhythm, normal heart sounds and intact distal pulses.   Pulmonary/Chest: Effort normal and breath sounds normal.  Neurological: He is alert.  As usual he demonstrates problems with attention and with memory. He asks me the same questions several times and then cannot remember my answers   Psychiatric: He has a normal mood and affect. His behavior is normal. Thought content normal.          Assessment & Plan:  His HTN is stable. His dementia however continues to progress, and I told him that he should not be driving. He said he would "think about it".

## 2015-03-08 ENCOUNTER — Telehealth: Payer: Self-pay | Admitting: Family Medicine

## 2015-03-08 DIAGNOSIS — T148 Other injury of unspecified body region: Secondary | ICD-10-CM | POA: Diagnosis not present

## 2015-03-08 DIAGNOSIS — L859 Epidermal thickening, unspecified: Secondary | ICD-10-CM | POA: Diagnosis not present

## 2015-03-08 DIAGNOSIS — L0103 Bullous impetigo: Secondary | ICD-10-CM | POA: Diagnosis not present

## 2015-03-08 NOTE — Telephone Encounter (Signed)
Wife would like a call back concerning pt visit on last week

## 2015-03-09 NOTE — Telephone Encounter (Signed)
I spoke with Travis Ware and she said that pt is more agitated now and has a short temper, doesn't seem to think Ativan is helping. Can you recommend something else to help with this?

## 2015-03-09 NOTE — Telephone Encounter (Signed)
Have him continue the Lorazepam but start him on Namenda 10 mg bid every day. Call in #[phone] with 5 rf.

## 2015-03-10 MED ORDER — MEMANTINE HCL 10 MG PO TABS: 10.0000 mg | ORAL_TABLET | Freq: Two times a day (BID) | ORAL | Status: DC

## 2015-03-10 NOTE — Telephone Encounter (Signed)
I spoke with Travis Ware and she will let home health aid know to pick up script for pt.

## 2015-03-10 NOTE — Telephone Encounter (Signed)
I sent script e-scribe, tried to reach [place] and line is busy. I will try again later.

## 2015-03-28 ENCOUNTER — Encounter: Payer: Self-pay | Admitting: Family Medicine

## 2015-03-28 ENCOUNTER — Ambulatory Visit (INDEPENDENT_AMBULATORY_CARE_PROVIDER_SITE_OTHER): Payer: Medicare Other | Admitting: Family Medicine

## 2015-03-28 VITALS — BP 143/70 | HR 48 | Temp 97.9°F | Ht 70.0 in | Wt 174.0 lb

## 2015-03-28 DIAGNOSIS — H269 Unspecified cataract: Secondary | ICD-10-CM

## 2015-03-28 DIAGNOSIS — R413 Other amnesia: Secondary | ICD-10-CM | POA: Diagnosis not present

## 2015-03-28 DIAGNOSIS — I10 Essential (primary) hypertension: Secondary | ICD-10-CM

## 2015-03-28 NOTE — Progress Notes (Signed)
   Subjective:    Patient ID: Travis Ware, male    DOB: 1930-04-30, 81 y.o.   MRN: [id number]  HPI Here with his nursing aide, Travis Ware, to follow up on memory loss. We have already submitted a document to the [facility] stating that he is not safe to drive in my opinion, and for the most part Travis Ware has honored this. Lately Travis Ware has been doing all te driving. In addition to his cognition issues he is also having vision problems. He sees Dr. Katy Ware for eye care, and he has been diagnosed with cataracts. He admits that is vision is blurry. The family is debating about whether or not to proceed with cataract surgery. He feels well otherwise. Travis Ware says that the Lorazepam has the opposite effect on him and seems to make him agitated, so we agreed to stop this.    Review of Systems  Constitutional: Negative.   Eyes: Positive for visual disturbance.  Respiratory: Negative.   Cardiovascular: Negative.   Neurological: Negative for dizziness, tremors, syncope, weakness and light-headedness.       Objective:   Physical Exam  Constitutional: He appears well-developed and well-nourished.  Cardiovascular: Normal rate, regular rhythm, normal heart sounds and intact distal pulses.   Pulmonary/Chest: Effort normal and breath sounds normal.  Neurological: He is alert.  oriented to place and self, but not the date           Assessment & Plan:  His memory loss continues to be a problem, and he has not seen Dr. Delice Ware in one year. We will set up a follow up visit with her soon. He will follow up with Dr. Katy Ware. Everyone agreed that he should not drive at this time.

## 2015-03-28 NOTE — Progress Notes (Signed)
Pre visit review using our clinic review tool, if applicable. No additional management support is needed unless otherwise documented below in the visit note. 

## 2015-03-29 ENCOUNTER — Encounter: Payer: Self-pay | Admitting: Neurology

## 2015-03-29 ENCOUNTER — Ambulatory Visit (INDEPENDENT_AMBULATORY_CARE_PROVIDER_SITE_OTHER): Payer: Medicare Other | Admitting: Neurology

## 2015-03-29 VITALS — BP 130/76 | HR 45 | Ht 70.0 in | Wt 173.0 lb

## 2015-03-29 DIAGNOSIS — F03B18 Unspecified dementia, moderate, with other behavioral disturbance: Secondary | ICD-10-CM

## 2015-03-29 DIAGNOSIS — F0390 Unspecified dementia without behavioral disturbance: Secondary | ICD-10-CM | POA: Insufficient documentation

## 2015-03-29 DIAGNOSIS — F03B0 Unspecified dementia, moderate, without behavioral disturbance, psychotic disturbance, mood disturbance, and anxiety: Secondary | ICD-10-CM | POA: Insufficient documentation

## 2015-03-29 DIAGNOSIS — I10 Essential (primary) hypertension: Secondary | ICD-10-CM | POA: Diagnosis not present

## 2015-03-29 DIAGNOSIS — F0391 Unspecified dementia with behavioral disturbance: Secondary | ICD-10-CM | POA: Diagnosis not present

## 2015-03-29 MED ORDER — DONEPEZIL HCL 10 MG PO TABS: ORAL_TABLET | ORAL | Status: DC
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
## 2015-03-29 NOTE — Patient Instructions (Signed)
1. Schedule MRI brain without contrast 2. Start Aricept 10mg : Take 1/2 tablet daily for 1 week, then increase to 1 tablet daily 3. Continue Namenda 10mg  twice a day 4. No driving due to memory and vision problems 5. Follow-up in 6 months, call for any problems

## 2015-03-30 DIAGNOSIS — H538 Other visual disturbances: Secondary | ICD-10-CM | POA: Diagnosis not present

## 2015-03-30 DIAGNOSIS — F0390 Unspecified dementia without behavioral disturbance: Secondary | ICD-10-CM | POA: Diagnosis not present

## 2015-04-01 ENCOUNTER — Telehealth: Payer: Self-pay | Admitting: Neurology

## 2015-04-01 NOTE — Telephone Encounter (Signed)
Please advise 

## 2015-04-01 NOTE — Telephone Encounter (Signed)
Pls let wife know the MRI brain is unremarkable, no evidence of tumor, stroke, or bleed. It shows age-related changes. Pls explain to wife that when people have memory changes, we want to make sure it is not from a tumor or stroke, which he does not have. This tells [place] memory changes are likely due to dementia, continue his medication for memory (Namenda). Thanks

## 2015-04-01 NOTE — Telephone Encounter (Signed)
Pt's wife called and wanted to know if the results from his MRI are available/Dawn CB# [phone] or [phone]

## 2015-04-01 NOTE — Telephone Encounter (Signed)
I gave patient's wife the results.  She is requesting a letter stating all of this and a copy of his mini mental exam.

## 2015-04-04 ENCOUNTER — Encounter: Payer: Self-pay | Admitting: Neurology

## 2015-04-04 NOTE — Telephone Encounter (Signed)
Pt wife called back today to request/a copy of his last test results/ call back @ [phone] or [phone]

## 2015-04-04 NOTE — Telephone Encounter (Signed)
I wrote a letter as requested, can you print out the MMSE from [place] and give to wife?  Thanks

## 2015-04-04 NOTE — Telephone Encounter (Signed)
I spoke with patient's wife/Carol. Letter is ready along with a copy of patient's most recent MMSE. At her request I will mail these documents to her.

## 2015-04-04 NOTE — Telephone Encounter (Signed)
Please review

## 2015-04-08 ENCOUNTER — Telehealth: Payer: Self-pay | Admitting: Neurology

## 2015-04-08 ENCOUNTER — Encounter: Payer: Self-pay | Admitting: Family Medicine

## 2015-04-08 ENCOUNTER — Ambulatory Visit (INDEPENDENT_AMBULATORY_CARE_PROVIDER_SITE_OTHER): Payer: Medicare Other | Admitting: Family Medicine

## 2015-04-08 ENCOUNTER — Telehealth: Payer: Self-pay | Admitting: Family Medicine

## 2015-04-08 VITALS — BP 155/71 | HR 52 | Temp 97.9°F | Ht 70.0 in | Wt 178.0 lb

## 2015-04-08 DIAGNOSIS — I10 Essential (primary) hypertension: Secondary | ICD-10-CM | POA: Diagnosis not present

## 2015-04-08 DIAGNOSIS — F0391 Unspecified dementia with behavioral disturbance: Secondary | ICD-10-CM | POA: Diagnosis not present

## 2015-04-08 DIAGNOSIS — F03B18 Unspecified dementia, moderate, with other behavioral disturbance: Secondary | ICD-10-CM

## 2015-04-08 NOTE — Telephone Encounter (Signed)
URGENT//[name] requests the most recent Brain MRI to be faxed today//due to a legal matter//FAX to/ [phone]

## 2015-04-08 NOTE — Progress Notes (Signed)
Pre visit review using our clinic review tool, if applicable. No additional management support is needed unless otherwise documented below in the visit note. 
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
## 2015-04-08 NOTE — Telephone Encounter (Signed)
note

## 2015-04-08 NOTE — Telephone Encounter (Signed)
Patients son, Sherren Mocha, called demanding to speak with a Freight forwarder. States that our doctors need to get on the same [place] because they are causing legal difficulties. Says that the family cannot have legal financial papers drawn up due to conflicting diagnosis between Dr Delice Lesch and Dr Sarajane Jews. Wanted to be put in touch with an ombudsman within our organization. Was very aggressive and when I explained that he could reach out to an ombudsman himself. Began cursing so I disconnected the call.

## 2015-04-08 NOTE — Telephone Encounter (Signed)
MRI report faxed. [name] will call back to speak with Dr. Delice Lesch on Monday.

## 2015-04-08 NOTE — Telephone Encounter (Signed)
error 

## 2015-04-20 DIAGNOSIS — I8311 Varicose veins of right lower extremity with inflammation: Secondary | ICD-10-CM | POA: Diagnosis not present

## 2015-04-25 ENCOUNTER — Telehealth: Payer: Self-pay | Admitting: Family Medicine
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
## 2015-04-25 NOTE — Telephone Encounter (Signed)
Paperwork was put in the mail to [facility], their fax was down on 04/18/2015, that's the reason it was put in mail. I left a voice message with this information for Arbie Cookey.

## 2015-05-12 ENCOUNTER — Encounter: Payer: Self-pay | Admitting: Family Medicine

## 2015-06-20 ENCOUNTER — Telehealth: Payer: Self-pay | Admitting: Neurology

## 2015-06-20 NOTE — Telephone Encounter (Signed)
PT's assistant [name] called and wanted a call back in regards to him having problems with his ear and he refusing to see his PCP/Dawn CB# [phone]

## 2015-06-20 NOTE — Telephone Encounter (Signed)
Tried returning call. No answer & vm not set up yet. Will try again.

## 2015-06-20 NOTE — Telephone Encounter (Signed)
I called again and spoke with [name]. She states that patient has a lot of wax build up in one of his ears and she's  Tried otc wax removal aides but without improvement. She states the patient refuses to go back to see his pcp. I did tell her the only other suggestion that I could give her would be to take him to one of the urgent cares to get his ear flushed out to see if that helped. She also mentioned that she might have to call us back because paitent might need to f/u with Dr. Delice Lesch earlier than May. I told her to call back when she was ready and we could look at Dr. Amparo Bristol schedule to see if she has any earlier appts if she does then we could put him in an earlier spot.

## 2015-06-21 DIAGNOSIS — H6123 Impacted cerumen, bilateral: Secondary | ICD-10-CM | POA: Diagnosis not present

## 2015-06-21 DIAGNOSIS — F0390 Unspecified dementia without behavioral disturbance: Secondary | ICD-10-CM | POA: Diagnosis not present

## 2015-06-21 DIAGNOSIS — J3489 Other specified disorders of nose and nasal sinuses: Secondary | ICD-10-CM | POA: Diagnosis not present

## 2015-06-21 DIAGNOSIS — Z79899 Other long term (current) drug therapy: Secondary | ICD-10-CM | POA: Diagnosis not present

## 2015-06-21 DIAGNOSIS — I10 Essential (primary) hypertension: Secondary | ICD-10-CM | POA: Diagnosis not present

## 2015-06-28 DIAGNOSIS — H25811 Combined forms of age-related cataract, right eye: Secondary | ICD-10-CM | POA: Diagnosis not present

## 2015-06-28 DIAGNOSIS — H2512 Age-related nuclear cataract, left eye: Secondary | ICD-10-CM | POA: Diagnosis not present

## 2015-06-28 DIAGNOSIS — H43812 Vitreous degeneration, left eye: Secondary | ICD-10-CM | POA: Diagnosis not present

## 2015-06-29 ENCOUNTER — Telehealth: Payer: Self-pay | Admitting: Neurology

## 2015-06-29 NOTE — Telephone Encounter (Signed)
PT's wife Arbie Cookey called in regards to a letter that Dr Delice Lesch had written CB# [phone]
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
## 2015-07-03 DIAGNOSIS — J019 Acute sinusitis, unspecified: Secondary | ICD-10-CM | POA: Diagnosis not present

## 2015-07-06 DIAGNOSIS — H6122 Impacted cerumen, left ear: Secondary | ICD-10-CM | POA: Diagnosis not present

## 2015-07-06 DIAGNOSIS — J019 Acute sinusitis, unspecified: Secondary | ICD-10-CM | POA: Diagnosis not present

## 2015-07-06 DIAGNOSIS — B9689 Other specified bacterial agents as the cause of diseases classified elsewhere: Secondary | ICD-10-CM | POA: Diagnosis not present

## 2015-07-06 DIAGNOSIS — F0390 Unspecified dementia without behavioral disturbance: Secondary | ICD-10-CM | POA: Diagnosis not present

## 2015-07-13 DIAGNOSIS — F0390 Unspecified dementia without behavioral disturbance: Secondary | ICD-10-CM | POA: Diagnosis not present

## 2015-07-13 DIAGNOSIS — I10 Essential (primary) hypertension: Secondary | ICD-10-CM | POA: Diagnosis not present

## 2015-07-25 DIAGNOSIS — H25812 Combined forms of age-related cataract, left eye: Secondary | ICD-10-CM | POA: Diagnosis not present

## 2015-07-25 DIAGNOSIS — H2512 Age-related nuclear cataract, left eye: Secondary | ICD-10-CM | POA: Diagnosis not present

## 2015-08-01 DIAGNOSIS — H2511 Age-related nuclear cataract, right eye: Secondary | ICD-10-CM | POA: Diagnosis not present

## 2015-08-02 ENCOUNTER — Ambulatory Visit
Admission: RE | Admit: 2015-08-02 | Discharge: 2015-08-02 | Disposition: A | Discharge status: Home / Self Care | Payer: Medicare Other | Source: Ambulatory Visit | Attending: Family Medicine | Admitting: Family Medicine

## 2015-08-02 ENCOUNTER — Other Ambulatory Visit: Payer: Self-pay | Admitting: Family Medicine

## 2015-08-02 DIAGNOSIS — F0390 Unspecified dementia without behavioral disturbance: Secondary | ICD-10-CM | POA: Diagnosis not present

## 2015-08-02 DIAGNOSIS — Z79899 Other long term (current) drug therapy: Secondary | ICD-10-CM | POA: Diagnosis not present

## 2015-08-02 DIAGNOSIS — I10 Essential (primary) hypertension: Secondary | ICD-10-CM | POA: Diagnosis not present

## 2015-08-02 DIAGNOSIS — R0981 Nasal congestion: Secondary | ICD-10-CM

## 2015-08-04 DIAGNOSIS — H353131 Nonexudative age-related macular degeneration, bilateral, early dry stage: Secondary | ICD-10-CM | POA: Diagnosis not present

## 2015-08-11 DIAGNOSIS — J310 Chronic rhinitis: Secondary | ICD-10-CM | POA: Diagnosis not present

## 2015-08-22 DIAGNOSIS — H2511 Age-related nuclear cataract, right eye: Secondary | ICD-10-CM | POA: Diagnosis not present

## 2015-08-22 DIAGNOSIS — H25811 Combined forms of age-related cataract, right eye: Secondary | ICD-10-CM | POA: Diagnosis not present

## 2015-09-15 DIAGNOSIS — R05 Cough: Secondary | ICD-10-CM | POA: Diagnosis not present

## 2015-09-20 DIAGNOSIS — J209 Acute bronchitis, unspecified: Secondary | ICD-10-CM | POA: Diagnosis not present

## 2015-09-26 ENCOUNTER — Ambulatory Visit: Payer: Medicare Other | Admitting: Neurology

## 2015-10-21 DIAGNOSIS — R001 Bradycardia, unspecified: Secondary | ICD-10-CM | POA: Diagnosis not present

## 2015-10-21 DIAGNOSIS — I10 Essential (primary) hypertension: Secondary | ICD-10-CM | POA: Diagnosis not present

## 2015-10-21 DIAGNOSIS — Z79899 Other long term (current) drug therapy: Secondary | ICD-10-CM | POA: Diagnosis not present

## 2015-10-21 DIAGNOSIS — F0390 Unspecified dementia without behavioral disturbance: Secondary | ICD-10-CM | POA: Diagnosis not present

## 2015-11-01 ENCOUNTER — Other Ambulatory Visit: Payer: Self-pay | Admitting: Family Medicine

## 2015-11-02 DIAGNOSIS — I8311 Varicose veins of right lower extremity with inflammation: Secondary | ICD-10-CM | POA: Diagnosis not present

## 2015-11-17 DIAGNOSIS — H43813 Vitreous degeneration, bilateral: Secondary | ICD-10-CM | POA: Diagnosis not present

## 2015-11-17 DIAGNOSIS — H43393 Other vitreous opacities, bilateral: Secondary | ICD-10-CM | POA: Diagnosis not present

## 2015-12-22 ENCOUNTER — Encounter: Payer: Self-pay | Admitting: Neurology

## 2015-12-22 ENCOUNTER — Ambulatory Visit (INDEPENDENT_AMBULATORY_CARE_PROVIDER_SITE_OTHER): Payer: Medicare Other | Admitting: Neurology

## 2015-12-22 VITALS — BP 120/70 | HR 52 | Ht 70.0 in | Wt 174.0 lb

## 2015-12-22 DIAGNOSIS — F0391 Unspecified dementia with behavioral disturbance: Secondary | ICD-10-CM | POA: Diagnosis not present

## 2015-12-22 DIAGNOSIS — F03B18 Unspecified dementia, moderate, with other behavioral disturbance: Secondary | ICD-10-CM

## 2015-12-22 NOTE — Progress Notes (Signed)
Note routed

## 2015-12-22 NOTE — Progress Notes (Signed)
NEUROLOGY FOLLOW UP OFFICE NOTE  Travis Ware [id number]  HISTORY OF PRESENT ILLNESS: I had the pleasure of seeing Travis Ware in follow-up in the neurology clinic on 12/22/2015.  The patient was last seen 8 months ago for moderate dementia. His MMSE in November 2016 was 15/30. He is accompanied by his wife and caregiver Travis Ware who help supplement the history today. Since his last visit, he reports his memory is not worse. He however has no insight into his condition, and keeps asking what is the point of his visit, and kept repeating his main concern is the fogginess in his vision. His wife feels his memory is worse. He is repetitive, and does not recall conversations from 3 minutes prior. He bathes regularly without need for assistance but would wear the same clothes all the time. He is not supposed to be driving per [place], however sometimes he escapes and has driven to the mailbox and past it a few times. He denies getting lost driving. His wife is now in charge of his finances, but states that he has difficulties with his cash on hand, he gives the wrong tip and carries a lot of cash with him. He has paranoia mostly at night, he keeps the light on inside and outside, no hallucinations. He is taking Aricept 10mg  daily and Namenda 10mg  BID without side effects, his caregiver administers the medications. He said he was not taking medication, then was reminded by Travis Ware about this. He denies any headaches, dizziness, focal numbness/tingling/weakness, no falls.   I personally reviewed MRI brain without contrast done 03/2015 which did not show any acute changes, there was mild diffuse volume loss, mild microvascular disease.  HPI: This is a pleasant 82 yo RH man with a history of hypertension who presented in 02/2014 for evaluation of memory loss.He had minimal insight into his condition, stating he did not know why he is being evaluated because there is no problem with his memory and that his  wife "started all this." He tells me that he has been having problems with his marriage and that he has not been living with his wife. He states "I don't know that I really have a memory problem." He lives in a large property and reported he drives without getting lost. He denies any missed bill payments and denies missing medication doses. He has left the stove on a few times. He denies any family history of memory problems. 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 She lives in [place], however family members and friends have expressed concern. They started noticing memory changes around 4 years ago, but a more rapid decline recently. He has told friends that he has gotten lost. His wife has breast cancer and he has become mean to her. His daughter had to remind him several times about the appointment, which he was resistant to going to. Per records, his wife had reported losing his keys, getting lost driving, not knowing what day of the week it is. He does not take care of his house or dog properly, does not launder his clothes. He scored 25/30 on MMSE with his PCP.  His family has been informing [name] that memory has been worsening, he was also having agitation. Ativan caused more agitation. He was started on Namenda 10mg  BID. He continues to report there is "nothing wrong with him," but realizing that his memory is "fair, not as good  as it has been."   PAST MEDICAL HISTORY: Past Medical History:  Diagnosis Date  . Chickenpox    as a child   . GERD (gastroesophageal reflux disease)   . Hypertension   . Measles    at age 82    MEDICATIONS: Current Outpatient Prescriptions on File Prior to Visit  Medication Sig Dispense Refill  . donepezil (ARICEPT) 10 MG tablet Take 1/2 tablet daily for 1 week, then increase to 1 tablet daily 30 tablet 11  . losartan-hydrochlorothiazide (HYZAAR) 50-12.5 MG tablet TAKE 1 TABLET ONCE DAILY. 90 tablet  2  . memantine (NAMENDA) 10 MG tablet Take 1 tablet (10 mg total) by mouth 2 (two) times daily. 60 tablet 5  . metoprolol succinate (TOPROL-XL) 50 MG 24 hr tablet Take 1 tablet (50 mg total) by mouth daily. Take with or immediately following a meal. (Patient taking differently: Take 25 mg by mouth daily. Take with or immediately following a meal.) 90 tablet 3   No current facility-administered medications on file prior to visit.     ALLERGIES: No Known Allergies  FAMILY HISTORY: Family History  Problem Relation Age of Onset  . Stroke Mother   . Hypertension Father     SOCIAL HISTORY: Social History   Social History  . Marital status: Married    Spouse name: [name]  . Number of children: [name]  . Years of education: [name]   Occupational History  . Not on file.   Social History Main Topics  . Smoking status: Former Smoker    Quit date: 03/12/1994  . Smokeless tobacco: Never Used  . Alcohol use 0.0 oz/week     Comment: weekly  . Drug use: No  . Sexual activity: Not on file   Other Topics Concern  . Not on file   Social History Narrative  . No narrative on file    REVIEW OF SYSTEMS: Constitutional: No fevers, chills, or sweats, no generalized fatigue, change in appetite Eyes: as above Ear, nose and throat: No hearing loss, ear pain, nasal congestion, sore throat Cardiovascular: No chest pain, palpitations Respiratory:  No shortness of breath at rest or with exertion, wheezes GastrointestinaI: No nausea, vomiting, diarrhea, abdominal pain, fecal incontinence Genitourinary:  No dysuria, urinary retention or frequency Musculoskeletal:  No neck pain, back pain Integumentary: No rash, pruritus, skin lesions Neurological: as above Psychiatric: No depression, insomnia, anxiety Endocrine: No palpitations, fatigue, diaphoresis, mood swings, change in appetite, change in weight, increased thirst Hematologic/Lymphatic:  No anemia, purpura, petechiae. Allergic/Immunologic: no  itchy/runny eyes, nasal congestion, recent allergic reactions, rashes  PHYSICAL EXAM: Vitals:   12/22/15 1011  BP: 120/70  Pulse: (!) 52   General: No acute distress Head:  Normocephalic/atraumatic Neck: supple, no paraspinal tenderness, full range of motion Heart:  Regular rate and rhythm Lungs:  Clear to auscultation bilaterally Back: No paraspinal tenderness Skin/Extremities: No rash, no edema Neurological Exam: alert and oriented to person, place. No aphasia or dysarthria. Fund of knowledge is appropriate.  Remote memory intact.  Attention and concentration are normal.    Able to name objects and repeat phrases. Clock drawing test 3/5 MMSE - Mini Mental State Exam 12/22/2015 03/29/2015  Orientation to time 0 0  Orientation to Place 3 2  Registration 3 3  Attention/ Calculation 3 3  Recall 0 0  Language- name 2 objects 2 2  Language- repeat 1 1  Language- follow 3 step command 3 2  Language- read & follow direction 1 1  Write a  sentence 1 1  Copy design 1 0  Total score 18 15   Cranial nerves: Pupils equal, round, reactive to light. Extraocular movements intact with no nystagmus. Visual fields full. Facial sensation intact. No facial asymmetry. Tongue, uvula, palate midline.  Motor: Bulk and tone normal, muscle strength 5/5 throughout with no pronator drift.  Sensation to light touch intact.  No extinction to double simultaneous stimulation.  Deep tendon reflexes 2+ throughout, toes downgoing.  Finger to nose testing intact.  Gait narrow-based and steady, able to tandem walk adequately.  Romberg negative.  IMPRESSION: This is an 81 yo RH man with a history of hypertension with moderate dementia, likely Alzheimer type. MMSE today 18/30 (15/30 in November 2016). MRI brain unremarkable. We discussed his diagnosis, he has minimal insight into his condition and denies he has any problems. Continue Aricept 10mg  daily and Namenda 10mg  BID. We again discussed that he should not drive.  Continue 24/7 supervision. We discussed the importance of physical exercise and brain stimulation exercises for brain health. He will follow-up in 1 year and knows to call for any changes.   Thank you for allowing me to participate in his care.  Please do not hesitate to call for any questions or concerns.  The duration of this appointment visit was 25 minutes of face-to-face time with the patient.  Greater than 50% of this time was spent in counseling, explanation of diagnosis, planning of further management, and coordination of care.   Ellouise Newer, M.D.   CC: Dr. Sheryn Bison

## 2015-12-22 NOTE — Patient Instructions (Signed)
1. Continue Aricept and Namenda 2. No driving 3. Continue 24/7 supervision 4. Physical exercise and brain stimulation exercises are important for brain health 5. Follow-up in 1 year

## 2016-01-09 DIAGNOSIS — J019 Acute sinusitis, unspecified: Secondary | ICD-10-CM | POA: Diagnosis not present

## 2016-01-09 DIAGNOSIS — B9689 Other specified bacterial agents as the cause of diseases classified elsewhere: Secondary | ICD-10-CM | POA: Diagnosis not present

## 2016-02-02 DIAGNOSIS — L570 Actinic keratosis: Secondary | ICD-10-CM | POA: Diagnosis not present

## 2016-02-02 DIAGNOSIS — I872 Venous insufficiency (chronic) (peripheral): Secondary | ICD-10-CM | POA: Diagnosis not present

## 2016-02-28 DIAGNOSIS — R5383 Other fatigue: Secondary | ICD-10-CM | POA: Diagnosis not present

## 2016-03-12 DIAGNOSIS — S4992XA Unspecified injury of left shoulder and upper arm, initial encounter: Secondary | ICD-10-CM | POA: Diagnosis not present

## 2016-03-12 DIAGNOSIS — W19XXXA Unspecified fall, initial encounter: Secondary | ICD-10-CM | POA: Diagnosis not present

## 2016-03-12 DIAGNOSIS — M25512 Pain in left shoulder: Secondary | ICD-10-CM | POA: Diagnosis not present

## 2016-04-17 DIAGNOSIS — Z23 Encounter for immunization: Secondary | ICD-10-CM | POA: Diagnosis not present

## 2016-04-17 DIAGNOSIS — M25512 Pain in left shoulder: Secondary | ICD-10-CM | POA: Diagnosis not present

## 2016-04-17 DIAGNOSIS — J3489 Other specified disorders of nose and nasal sinuses: Secondary | ICD-10-CM | POA: Diagnosis not present

## 2016-04-17 DIAGNOSIS — F0390 Unspecified dementia without behavioral disturbance: Secondary | ICD-10-CM | POA: Diagnosis not present

## 2016-04-25 ENCOUNTER — Other Ambulatory Visit: Payer: Self-pay | Admitting: Neurology

## 2016-04-25 DIAGNOSIS — F0391 Unspecified dementia with behavioral disturbance: Secondary | ICD-10-CM

## 2016-04-25 DIAGNOSIS — F03B18 Unspecified dementia, moderate, with other behavioral disturbance: Secondary | ICD-10-CM

## 2016-05-08 DIAGNOSIS — I8311 Varicose veins of right lower extremity with inflammation: Secondary | ICD-10-CM | POA: Diagnosis not present

## 2016-05-15 ENCOUNTER — Other Ambulatory Visit: Payer: Self-pay | Admitting: Family Medicine

## 2016-06-12 DIAGNOSIS — R0981 Nasal congestion: Secondary | ICD-10-CM | POA: Diagnosis not present

## 2016-06-21 DIAGNOSIS — I872 Venous insufficiency (chronic) (peripheral): Secondary | ICD-10-CM | POA: Diagnosis not present

## 2016-06-21 DIAGNOSIS — L219 Seborrheic dermatitis, unspecified: Secondary | ICD-10-CM | POA: Diagnosis not present

## 2016-06-21 DIAGNOSIS — L719 Rosacea, unspecified: Secondary | ICD-10-CM | POA: Diagnosis not present

## 2016-06-21 DIAGNOSIS — Z23 Encounter for immunization: Secondary | ICD-10-CM | POA: Diagnosis not present

## 2016-07-25 DIAGNOSIS — R0981 Nasal congestion: Secondary | ICD-10-CM | POA: Diagnosis not present

## 2016-07-26 ENCOUNTER — Other Ambulatory Visit: Payer: Self-pay | Admitting: Family Medicine

## 2016-07-26 DIAGNOSIS — R0981 Nasal congestion: Secondary | ICD-10-CM

## 2016-07-30 ENCOUNTER — Ambulatory Visit
Admission: RE | Admit: 2016-07-30 | Discharge: 2016-07-30 | Disposition: A | Discharge status: Home / Self Care | Payer: Medicare Other | Source: Ambulatory Visit | Attending: Family Medicine | Admitting: Family Medicine

## 2016-07-30 DIAGNOSIS — R0981 Nasal congestion: Secondary | ICD-10-CM

## 2016-07-30 DIAGNOSIS — R0989 Other specified symptoms and signs involving the circulatory and respiratory systems: Secondary | ICD-10-CM | POA: Diagnosis not present

## 2016-08-31 ENCOUNTER — Ambulatory Visit
Admission: RE | Admit: 2016-08-31 | Discharge: 2016-08-31 | Disposition: A | Discharge status: Home / Self Care | Payer: Medicare Other | Source: Ambulatory Visit | Attending: Family Medicine | Admitting: Family Medicine

## 2016-08-31 ENCOUNTER — Other Ambulatory Visit: Payer: Self-pay | Admitting: Family Medicine

## 2016-08-31 DIAGNOSIS — R52 Pain, unspecified: Secondary | ICD-10-CM

## 2016-08-31 DIAGNOSIS — M25561 Pain in right knee: Secondary | ICD-10-CM | POA: Diagnosis not present

## 2016-08-31 DIAGNOSIS — Z79899 Other long term (current) drug therapy: Secondary | ICD-10-CM | POA: Diagnosis not present

## 2016-08-31 DIAGNOSIS — M2550 Pain in unspecified joint: Secondary | ICD-10-CM | POA: Diagnosis not present

## 2016-08-31 DIAGNOSIS — F0390 Unspecified dementia without behavioral disturbance: Secondary | ICD-10-CM | POA: Diagnosis not present

## 2016-08-31 DIAGNOSIS — I10 Essential (primary) hypertension: Secondary | ICD-10-CM | POA: Diagnosis not present

## 2016-09-18 DIAGNOSIS — M150 Primary generalized (osteo)arthritis: Secondary | ICD-10-CM | POA: Diagnosis not present

## 2016-09-18 DIAGNOSIS — E559 Vitamin D deficiency, unspecified: Secondary | ICD-10-CM | POA: Diagnosis not present

## 2016-09-18 DIAGNOSIS — R5383 Other fatigue: Secondary | ICD-10-CM | POA: Diagnosis not present

## 2016-09-20 ENCOUNTER — Other Ambulatory Visit: Payer: Self-pay | Admitting: Family Medicine

## 2016-09-20 DIAGNOSIS — E0590 Thyrotoxicosis, unspecified without thyrotoxic crisis or storm: Secondary | ICD-10-CM

## 2016-09-26 ENCOUNTER — Ambulatory Visit
Admission: RE | Admit: 2016-09-26 | Discharge: 2016-09-26 | Disposition: A | Discharge status: Home / Self Care | Payer: Medicare Other | Source: Ambulatory Visit | Attending: Family Medicine | Admitting: Family Medicine

## 2016-09-26 DIAGNOSIS — E0590 Thyrotoxicosis, unspecified without thyrotoxic crisis or storm: Secondary | ICD-10-CM

## 2016-09-26 DIAGNOSIS — E041 Nontoxic single thyroid nodule: Secondary | ICD-10-CM | POA: Diagnosis not present

## 2016-10-01 DIAGNOSIS — E0590 Thyrotoxicosis, unspecified without thyrotoxic crisis or storm: Secondary | ICD-10-CM | POA: Diagnosis not present

## 2016-10-01 DIAGNOSIS — E049 Nontoxic goiter, unspecified: Secondary | ICD-10-CM | POA: Diagnosis not present

## 2016-10-01 DIAGNOSIS — E042 Nontoxic multinodular goiter: Secondary | ICD-10-CM | POA: Diagnosis not present

## 2016-10-01 DIAGNOSIS — Z5181 Encounter for therapeutic drug level monitoring: Secondary | ICD-10-CM | POA: Diagnosis not present

## 2016-10-01 DIAGNOSIS — R1310 Dysphagia, unspecified: Secondary | ICD-10-CM | POA: Diagnosis not present

## 2016-10-02 ENCOUNTER — Other Ambulatory Visit: Payer: Self-pay | Admitting: Internal Medicine

## 2016-10-02 ENCOUNTER — Other Ambulatory Visit: Payer: Self-pay | Admitting: Family Medicine

## 2016-10-02 DIAGNOSIS — E042 Nontoxic multinodular goiter: Secondary | ICD-10-CM

## 2016-10-02 DIAGNOSIS — R1310 Dysphagia, unspecified: Secondary | ICD-10-CM

## 2016-10-16 ENCOUNTER — Other Ambulatory Visit: Payer: Self-pay | Admitting: Internal Medicine

## 2016-10-16 ENCOUNTER — Ambulatory Visit
Admission: RE | Admit: 2016-10-16 | Discharge: 2016-10-16 | Disposition: A | Discharge status: Home / Self Care | Payer: Medicare Other | Source: Ambulatory Visit | Attending: Internal Medicine | Admitting: Internal Medicine

## 2016-10-16 ENCOUNTER — Other Ambulatory Visit (HOSPITAL_COMMUNITY)
Admission: RE | Admit: 2016-10-16 | Discharge: 2016-10-16 | Disposition: A | Discharge status: Home / Self Care | Payer: Medicare Other | Source: Ambulatory Visit | Attending: General Surgery | Admitting: General Surgery

## 2016-10-16 ENCOUNTER — Ambulatory Visit
Admission: RE | Admit: 2016-10-16 | Discharge: 2016-10-16 | Disposition: A | Discharge status: Home / Self Care | Payer: Medicare Other | Source: Ambulatory Visit | Attending: Family Medicine | Admitting: Family Medicine

## 2016-10-16 DIAGNOSIS — E042 Nontoxic multinodular goiter: Secondary | ICD-10-CM

## 2016-10-16 DIAGNOSIS — E041 Nontoxic single thyroid nodule: Secondary | ICD-10-CM | POA: Diagnosis not present

## 2016-10-16 DIAGNOSIS — R1310 Dysphagia, unspecified: Secondary | ICD-10-CM

## 2016-10-16 DIAGNOSIS — K449 Diaphragmatic hernia without obstruction or gangrene: Secondary | ICD-10-CM | POA: Diagnosis not present

## 2016-10-17 DIAGNOSIS — E0590 Thyrotoxicosis, unspecified without thyrotoxic crisis or storm: Secondary | ICD-10-CM | POA: Diagnosis not present

## 2016-10-17 DIAGNOSIS — F0390 Unspecified dementia without behavioral disturbance: Secondary | ICD-10-CM | POA: Diagnosis not present

## 2016-10-17 DIAGNOSIS — R1310 Dysphagia, unspecified: Secondary | ICD-10-CM | POA: Diagnosis not present

## 2016-10-17 DIAGNOSIS — E042 Nontoxic multinodular goiter: Secondary | ICD-10-CM | POA: Diagnosis not present

## 2016-10-17 DIAGNOSIS — J320 Chronic maxillary sinusitis: Secondary | ICD-10-CM | POA: Diagnosis not present

## 2016-10-17 DIAGNOSIS — R194 Change in bowel habit: Secondary | ICD-10-CM | POA: Diagnosis not present

## 2016-11-05 DIAGNOSIS — Z5181 Encounter for therapeutic drug level monitoring: Secondary | ICD-10-CM | POA: Diagnosis not present

## 2016-11-05 DIAGNOSIS — E042 Nontoxic multinodular goiter: Secondary | ICD-10-CM | POA: Diagnosis not present

## 2016-11-05 DIAGNOSIS — R1310 Dysphagia, unspecified: Secondary | ICD-10-CM | POA: Diagnosis not present

## 2016-11-05 DIAGNOSIS — E0590 Thyrotoxicosis, unspecified without thyrotoxic crisis or storm: Secondary | ICD-10-CM | POA: Diagnosis not present

## 2016-11-06 ENCOUNTER — Other Ambulatory Visit (HOSPITAL_COMMUNITY): Payer: Self-pay | Admitting: Internal Medicine

## 2016-11-06 DIAGNOSIS — E0590 Thyrotoxicosis, unspecified without thyrotoxic crisis or storm: Secondary | ICD-10-CM

## 2016-11-12 ENCOUNTER — Encounter (HOSPITAL_COMMUNITY)
Admission: RE | Admit: 2016-11-12 | Discharge: 2016-11-12 | Disposition: A | Discharge status: Home / Self Care | Payer: Medicare Other | Source: Ambulatory Visit | Attending: Internal Medicine | Admitting: Internal Medicine

## 2016-11-12 DIAGNOSIS — E0590 Thyrotoxicosis, unspecified without thyrotoxic crisis or storm: Secondary | ICD-10-CM | POA: Insufficient documentation

## 2016-11-12 MED ORDER — SODIUM IODIDE I 131 CAPSULE
8.1000 | Freq: Once | INTRAVENOUS | Status: AC | PRN
  Administered 2016-11-12: 8.1 via ORAL

## 2016-11-13 ENCOUNTER — Encounter (HOSPITAL_COMMUNITY)
Admission: RE | Admit: 2016-11-13 | Discharge: 2016-11-13 | Disposition: A | Discharge status: Home / Self Care | Payer: Medicare Other | Source: Ambulatory Visit | Attending: Internal Medicine | Admitting: Internal Medicine

## 2016-11-13 DIAGNOSIS — E0590 Thyrotoxicosis, unspecified without thyrotoxic crisis or storm: Secondary | ICD-10-CM | POA: Diagnosis not present

## 2016-11-19 ENCOUNTER — Other Ambulatory Visit (HOSPITAL_COMMUNITY): Payer: Self-pay | Admitting: Internal Medicine

## 2016-11-19 DIAGNOSIS — E0590 Thyrotoxicosis, unspecified without thyrotoxic crisis or storm: Secondary | ICD-10-CM

## 2016-11-21 DIAGNOSIS — R1310 Dysphagia, unspecified: Secondary | ICD-10-CM | POA: Diagnosis not present

## 2016-12-05 DIAGNOSIS — E0590 Thyrotoxicosis, unspecified without thyrotoxic crisis or storm: Secondary | ICD-10-CM | POA: Diagnosis not present

## 2016-12-05 DIAGNOSIS — E559 Vitamin D deficiency, unspecified: Secondary | ICD-10-CM | POA: Diagnosis not present

## 2016-12-06 ENCOUNTER — Encounter (HOSPITAL_COMMUNITY)
Admission: RE | Admit: 2016-12-06 | Discharge: 2016-12-06 | Disposition: A | Discharge status: Home / Self Care | Payer: Medicare Other | Source: Ambulatory Visit | Attending: Internal Medicine | Admitting: Internal Medicine

## 2016-12-06 DIAGNOSIS — E0590 Thyrotoxicosis, unspecified without thyrotoxic crisis or storm: Secondary | ICD-10-CM | POA: Diagnosis not present

## 2016-12-06 DIAGNOSIS — E062 Chronic thyroiditis with transient thyrotoxicosis: Secondary | ICD-10-CM | POA: Diagnosis not present

## 2016-12-06 MED ORDER — SODIUM IODIDE I 131 CAPSULE
29.0000 | Freq: Once | INTRAVENOUS | Status: AC | PRN
  Administered 2016-12-06: 29 via ORAL

## 2016-12-21 ENCOUNTER — Ambulatory Visit: Payer: Medicare Other | Admitting: Neurology

## 2016-12-21 DIAGNOSIS — E559 Vitamin D deficiency, unspecified: Secondary | ICD-10-CM | POA: Diagnosis not present

## 2016-12-21 DIAGNOSIS — Z79899 Other long term (current) drug therapy: Secondary | ICD-10-CM | POA: Diagnosis not present

## 2016-12-21 DIAGNOSIS — F0390 Unspecified dementia without behavioral disturbance: Secondary | ICD-10-CM | POA: Diagnosis not present

## 2016-12-21 DIAGNOSIS — E0590 Thyrotoxicosis, unspecified without thyrotoxic crisis or storm: Secondary | ICD-10-CM | POA: Diagnosis not present

## 2016-12-21 DIAGNOSIS — I10 Essential (primary) hypertension: Secondary | ICD-10-CM | POA: Diagnosis not present

## 2016-12-24 ENCOUNTER — Other Ambulatory Visit: Payer: Self-pay | Admitting: Family Medicine

## 2017-01-04 DIAGNOSIS — E0590 Thyrotoxicosis, unspecified without thyrotoxic crisis or storm: Secondary | ICD-10-CM | POA: Diagnosis not present

## 2017-01-18 DIAGNOSIS — J320 Chronic maxillary sinusitis: Secondary | ICD-10-CM | POA: Diagnosis not present

## 2017-01-18 DIAGNOSIS — M150 Primary generalized (osteo)arthritis: Secondary | ICD-10-CM | POA: Diagnosis not present

## 2017-01-18 DIAGNOSIS — H6122 Impacted cerumen, left ear: Secondary | ICD-10-CM | POA: Diagnosis not present

## 2017-01-18 DIAGNOSIS — F0390 Unspecified dementia without behavioral disturbance: Secondary | ICD-10-CM | POA: Diagnosis not present

## 2017-01-18 DIAGNOSIS — G8929 Other chronic pain: Secondary | ICD-10-CM | POA: Diagnosis not present

## 2017-01-18 DIAGNOSIS — I10 Essential (primary) hypertension: Secondary | ICD-10-CM | POA: Diagnosis not present

## 2017-01-18 DIAGNOSIS — E042 Nontoxic multinodular goiter: Secondary | ICD-10-CM | POA: Diagnosis not present

## 2017-02-05 DIAGNOSIS — E0590 Thyrotoxicosis, unspecified without thyrotoxic crisis or storm: Secondary | ICD-10-CM | POA: Diagnosis not present

## 2017-02-06 DIAGNOSIS — E042 Nontoxic multinodular goiter: Secondary | ICD-10-CM | POA: Diagnosis not present

## 2017-02-06 DIAGNOSIS — Z5181 Encounter for therapeutic drug level monitoring: Secondary | ICD-10-CM | POA: Diagnosis not present

## 2017-02-06 DIAGNOSIS — H6121 Impacted cerumen, right ear: Secondary | ICD-10-CM | POA: Diagnosis not present

## 2017-02-06 DIAGNOSIS — E0590 Thyrotoxicosis, unspecified without thyrotoxic crisis or storm: Secondary | ICD-10-CM | POA: Diagnosis not present

## 2017-02-07 DIAGNOSIS — R21 Rash and other nonspecific skin eruption: Secondary | ICD-10-CM | POA: Diagnosis not present

## 2017-02-07 DIAGNOSIS — F0390 Unspecified dementia without behavioral disturbance: Secondary | ICD-10-CM | POA: Diagnosis not present

## 2017-02-20 ENCOUNTER — Ambulatory Visit: Payer: Medicare Other | Admitting: Neurology

## 2017-02-20 DIAGNOSIS — Z029 Encounter for administrative examinations, unspecified: Secondary | ICD-10-CM

## 2017-02-25 ENCOUNTER — Encounter: Payer: Self-pay | Admitting: Neurology

## 2017-02-25 DIAGNOSIS — J0100 Acute maxillary sinusitis, unspecified: Secondary | ICD-10-CM | POA: Diagnosis not present

## 2017-02-25 DIAGNOSIS — J029 Acute pharyngitis, unspecified: Secondary | ICD-10-CM | POA: Diagnosis not present

## 2017-02-27 DIAGNOSIS — L719 Rosacea, unspecified: Secondary | ICD-10-CM | POA: Diagnosis not present

## 2017-02-27 DIAGNOSIS — Z23 Encounter for immunization: Secondary | ICD-10-CM | POA: Diagnosis not present

## 2017-02-27 DIAGNOSIS — L309 Dermatitis, unspecified: Secondary | ICD-10-CM | POA: Diagnosis not present

## 2017-03-07 DIAGNOSIS — J0100 Acute maxillary sinusitis, unspecified: Secondary | ICD-10-CM | POA: Diagnosis not present

## 2017-03-07 DIAGNOSIS — R0602 Shortness of breath: Secondary | ICD-10-CM | POA: Diagnosis not present

## 2017-03-12 DIAGNOSIS — E0590 Thyrotoxicosis, unspecified without thyrotoxic crisis or storm: Secondary | ICD-10-CM | POA: Diagnosis not present

## 2017-03-14 DIAGNOSIS — Z23 Encounter for immunization: Secondary | ICD-10-CM | POA: Diagnosis not present

## 2017-03-14 DIAGNOSIS — B356 Tinea cruris: Secondary | ICD-10-CM | POA: Diagnosis not present

## 2017-03-21 ENCOUNTER — Ambulatory Visit (INDEPENDENT_AMBULATORY_CARE_PROVIDER_SITE_OTHER): Payer: Medicare Other | Admitting: Neurology

## 2017-03-21 ENCOUNTER — Encounter: Payer: Self-pay | Admitting: Neurology

## 2017-03-21 VITALS — BP 148/76 | HR 60 | Ht 70.0 in | Wt 163.0 lb

## 2017-03-21 DIAGNOSIS — F03B18 Unspecified dementia, moderate, with other behavioral disturbance: Secondary | ICD-10-CM

## 2017-03-21 DIAGNOSIS — F0391 Unspecified dementia with behavioral disturbance: Secondary | ICD-10-CM

## 2017-03-21 MED ORDER — QUETIAPINE FUMARATE 25 MG PO TABS: ORAL_TABLET | ORAL | 6 refills | Status: DC

## 2017-03-21 NOTE — Progress Notes (Signed)
NEUROLOGY FOLLOW UP OFFICE NOTE  Travis Ware [id number]  HISTORY OF PRESENT ILLNESS: I had the pleasure of seeing Travis Ware in follow-up in the neurology clinic on 03/21/2017.  The patient was last seen more than a year ago for moderate dementia. His MMSE in July 2017 was 18/30 (15/30 in November 2016). He is accompanied by his wife and caregiver Travis Ware who help supplement the history today. Since his last visit, he feels his memory is "pretty good." Travis Ware stays with him during the weekdays. His wife picks him up for meals during the weekends. He is alone at night and they report he is fine at night. He is not supposed to drive, the DMV deemed this a year ago. His wife however reports that he does not drive at night, but when Travis Ware is away for the weekend, he drives the car. There have been times she would come and the car is not there. She has called the sheriff several times. Family is afraid to take his keys away. He is not managing his finances any longer, he has a trust and the bank is in charge now. Travis Ware reports he is "up and down" with his medications, he does not think he needs it. He is on Aricept, Namenda, and blood pressure medications. His family presents today asking for something to "mellow him out." They are specifically asking for Seroquel. They report he is sometimes extremely difficult to deal with, paranoid around 5pm. It is a lot of trouble getting him to take his medications in the evening. Despite this, he seems to do okay alone at night. He is able to dress and bathe independently. He recently had RAI therapy for hyperthyroidism. Several times during the visit, he reports his biggest problem is "a little haze in my system." He denies any headaches, dizziness, diplopia, focal numbness/tingling/weakness, no falls.   HPI: This is a pleasant 83 yo RH man with a history of hypertension who presented in 02/2014 for evaluation of memory loss.He had minimal insight into his condition,  stating he did not know why he is being evaluated because there is no problem with his memory and that his wife "started all this." He tells me that he has been having problems with his marriage and that he has not been living with his wife. He states "I don't know that I really have a memory problem." He lives in a large property and reported he drives without getting lost. He denies any missed bill payments and denies missing medication doses. He has left the stove on a few times. He denies any family history of memory problems. 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 She lives in [place], however family members and friends have expressed concern. They started noticing memory changes around 4 years ago, but a more rapid decline recently. He has told friends that he has gotten lost. His wife has breast cancer and he has become mean to her. His daughter had to remind him several times about the appointment, which he was resistant to going to. Per records, his wife had reported losing his keys, getting lost driving, not knowing what day of the week it is. He does not take care of his house or dog properly, does not launder his clothes. He scored 25/30 on MMSE with his PCP.  His family has been informing [name] that memory has been worsening, he was also having agitation. Ativan caused  more agitation. He was started on Namenda 10mg  BID. He continues to report there is "nothing wrong with him," but realizing that his memory is "fair, not as good as it has been."    I personally reviewed MRI brain without contrast done 03/2015 which did not show any acute changes, there was mild diffuse volume loss, mild microvascular disease.   PAST MEDICAL HISTORY: Past Medical History:  Diagnosis Date  . Chickenpox    as a child   . GERD (gastroesophageal reflux disease)   . Hypertension   . Measles    at age 78    MEDICATIONS:  Outpatient Encounter  Prescriptions as of 03/21/2017  Medication Sig  . donepezil (ARICEPT) 10 MG tablet TAKE 1/2 TABLET DAILY FOR 1 WEEK, THEN INCREASE TO 1 TABLET DAILY.  Marland Kitchen losartan-hydrochlorothiazide (HYZAAR) 50-12.5 MG tablet TAKE 1 TABLET ONCE DAILY.  . memantine (NAMENDA) 10 MG tablet TAKE 1 TABLET TWICE DAILY.  . metoprolol succinate (TOPROL-XL) 50 MG 24 hr tablet Take 1 tablet (50 mg total) by mouth daily. Take with or immediately following a meal. (Patient taking differently: Take 25 mg by mouth daily. Take with or immediately following a meal.)  .     No facility-administered encounter medications on file as of 03/21/2017.      ALLERGIES: No Known Allergies  FAMILY HISTORY: Family History  Problem Relation Age of Onset  . Stroke Mother   . Hypertension Father     SOCIAL HISTORY: Social History   Social History  . Marital status: Married    Spouse name: [name]  . Number of children: [name]  . Years of education: [name]   Occupational History  . Not on file.   Social History Main Topics  . Smoking status: Former Smoker    Quit date: 03/12/1994  . Smokeless tobacco: Never Used  . Alcohol use 0.0 oz/week     Comment: weekly  . Drug use: No  . Sexual activity: Not on file   Other Topics Concern  . Not on file   Social History Narrative  . No narrative on file    REVIEW OF SYSTEMS: Constitutional: No fevers, chills, or sweats, no generalized fatigue, change in appetite Eyes: as above Ear, nose and throat: No hearing loss, ear pain, nasal congestion, sore throat Cardiovascular: No chest pain, palpitations Respiratory:  No shortness of breath at rest or with exertion, wheezes GastrointestinaI: No nausea, vomiting, diarrhea, abdominal pain, fecal incontinence Genitourinary:  No dysuria, urinary retention or frequency Musculoskeletal:  No neck pain, back pain Integumentary: No rash, pruritus, skin lesions Neurological: as above Psychiatric: No depression, insomnia, anxiety Endocrine:  No palpitations, fatigue, diaphoresis, mood swings, change in appetite, change in weight, increased thirst Hematologic/Lymphatic:  No anemia, purpura, petechiae. Allergic/Immunologic: no itchy/runny eyes, nasal congestion, recent allergic reactions, rashes  PHYSICAL EXAM: Vitals:   03/21/17 1307  BP: (!) 148/76  Pulse: 60  SpO2: 96%   General: No acute distress Head:  Normocephalic/atraumatic Neck: supple, no paraspinal tenderness, full range of motion Heart:  Regular rate and rhythm Lungs:  Clear to auscultation bilaterally Back: No paraspinal tenderness Skin/Extremities: No rash, no edema Neurological Exam: alert and oriented to person, place. No aphasia or dysarthria. Fund of knowledge is appropriate.  Remote memory intact.  Attention and concentration are normal.    Able to name objects and repeat phrases. Clock drawing test 4/5 MMSE - Mini Mental State Exam 03/21/2017 12/22/2015 03/29/2015  Orientation to time 0 0 0  Orientation to Place 4  3 2  Registration 3 3 3   Attention/ Calculation 2 3 3   Recall 0 0 0  Language- name 2 objects 2 2 2   Language- repeat 1 1 1   Language- follow 3 step command 3 3 2   Language- read & follow direction 1 1 1   Write a sentence 1 1 1   Copy design 1 1 0  Total score 18 18 15    Cranial nerves: Pupils equal, round, reactive to light. Extraocular movements intact with no nystagmus. Visual fields full. Facial sensation intact. No facial asymmetry. Tongue, uvula, palate midline.  Motor: Bulk and tone normal, muscle strength 5/5 throughout with no pronator drift.  Sensation to light touch intact.  No extinction to double simultaneous stimulation.  Deep tendon reflexes 2+ throughout, toes downgoing.  Finger to nose testing intact.  Gait narrow-based and steady, able to tandem walk adequately.  Romberg negative.  IMPRESSION: This is an 83 yo RH man with a history of hypertension with moderate dementia with behavioral disturbance, likely Alzheimer type. MMSE  today again 18/30 (18/30 in July 2017, 15/30 in November 2016). MRI brain unremarkable. His family is asking about Seroquel for behavioral changes. We discussed side effects of Seroquel, including black box cardiac warning. Recommend checking EKG first before starting medication, if EKG fine, start 12.5mg  qhs for a week, then increase to 12.5mg  BID. Continue Aricept and Namenda. We again had an extensive discussion about [place] driving laws and dementia, and that he should not drive. Family was advised to take his keys away, but they are very afraid of him. He continues to have minimal insight into his condition. Recommend 24/7 care and looking into Memory Care. He will follow-up in 6 months and knows to call for any changes.   Thank you for allowing me to participate in his care.  Please do not hesitate to call for any questions or concerns.  The duration of this appointment visit was 25 minutes of face-to-face time with the patient.  Greater than 50% of this time was spent in counseling, explanation of diagnosis, planning of further management, and coordination of care.   Ellouise Newer, M.D.   CC: Dr. Sheryn Bison, Dr. Delfina Redwood, Dr. Shelbie Proctor

## 2017-03-21 NOTE — Patient Instructions (Signed)
1. Check EKG 2. Start Seroquel 25mg : Take 1/2 tablet at night for a week, then increase to 1/2 tablet twice a day 3. Continue Aricept and Namenda 4. Follow-up in 6 months, call for any changes

## 2017-03-25 DIAGNOSIS — F0281 Dementia in other diseases classified elsewhere with behavioral disturbance: Secondary | ICD-10-CM | POA: Diagnosis not present

## 2017-03-25 DIAGNOSIS — G309 Alzheimer's disease, unspecified: Secondary | ICD-10-CM | POA: Diagnosis not present

## 2017-03-25 DIAGNOSIS — Z79899 Other long term (current) drug therapy: Secondary | ICD-10-CM | POA: Diagnosis not present

## 2017-03-25 DIAGNOSIS — Z136 Encounter for screening for cardiovascular disorders: Secondary | ICD-10-CM | POA: Diagnosis not present

## 2017-03-29 ENCOUNTER — Encounter: Payer: Self-pay | Admitting: Neurology

## 2017-04-01 DIAGNOSIS — Z23 Encounter for immunization: Secondary | ICD-10-CM | POA: Diagnosis not present

## 2017-04-08 DIAGNOSIS — E0590 Thyrotoxicosis, unspecified without thyrotoxic crisis or storm: Secondary | ICD-10-CM | POA: Diagnosis not present

## 2017-04-12 DIAGNOSIS — L810 Postinflammatory hyperpigmentation: Secondary | ICD-10-CM | POA: Diagnosis not present

## 2017-04-12 DIAGNOSIS — B359 Dermatophytosis, unspecified: Secondary | ICD-10-CM | POA: Diagnosis not present

## 2017-04-12 DIAGNOSIS — B353 Tinea pedis: Secondary | ICD-10-CM | POA: Diagnosis not present

## 2017-04-12 DIAGNOSIS — Z23 Encounter for immunization: Secondary | ICD-10-CM | POA: Diagnosis not present

## 2017-05-13 ENCOUNTER — Other Ambulatory Visit: Payer: Self-pay | Admitting: Neurology

## 2017-05-14 DIAGNOSIS — E0590 Thyrotoxicosis, unspecified without thyrotoxic crisis or storm: Secondary | ICD-10-CM | POA: Diagnosis not present

## 2017-05-14 DIAGNOSIS — J209 Acute bronchitis, unspecified: Secondary | ICD-10-CM | POA: Diagnosis not present

## 2017-06-05 ENCOUNTER — Other Ambulatory Visit: Payer: Self-pay | Admitting: Family Medicine

## 2017-06-06 ENCOUNTER — Other Ambulatory Visit: Payer: Self-pay

## 2017-06-06 MED ORDER — MEMANTINE HCL 10 MG PO TABS: 10.0000 mg | ORAL_TABLET | Freq: Two times a day (BID) | ORAL | 5 refills | Status: DC

## 2017-06-06 NOTE — Telephone Encounter (Signed)
Last OV 03/2015 pt needs an OV

## 2017-06-18 DIAGNOSIS — G309 Alzheimer's disease, unspecified: Secondary | ICD-10-CM | POA: Diagnosis not present

## 2017-06-18 DIAGNOSIS — F0281 Dementia in other diseases classified elsewhere with behavioral disturbance: Secondary | ICD-10-CM | POA: Diagnosis not present

## 2017-06-18 DIAGNOSIS — I10 Essential (primary) hypertension: Secondary | ICD-10-CM | POA: Diagnosis not present

## 2017-07-03 ENCOUNTER — Other Ambulatory Visit: Payer: Self-pay | Admitting: Neurology

## 2017-07-03 ENCOUNTER — Other Ambulatory Visit: Payer: Self-pay

## 2017-07-03 MED ORDER — DONEPEZIL HCL 10 MG PO TABS: 10.0000 mg | ORAL_TABLET | Freq: Every day | ORAL | 11 refills | Status: DC

## 2017-07-08 DIAGNOSIS — Z79899 Other long term (current) drug therapy: Secondary | ICD-10-CM | POA: Diagnosis not present

## 2017-07-08 DIAGNOSIS — E0590 Thyrotoxicosis, unspecified without thyrotoxic crisis or storm: Secondary | ICD-10-CM | POA: Diagnosis not present

## 2017-07-08 DIAGNOSIS — E042 Nontoxic multinodular goiter: Secondary | ICD-10-CM | POA: Diagnosis not present

## 2017-07-23 DIAGNOSIS — E559 Vitamin D deficiency, unspecified: Secondary | ICD-10-CM | POA: Diagnosis not present

## 2017-07-23 DIAGNOSIS — G309 Alzheimer's disease, unspecified: Secondary | ICD-10-CM | POA: Diagnosis not present

## 2017-07-23 DIAGNOSIS — I10 Essential (primary) hypertension: Secondary | ICD-10-CM | POA: Diagnosis not present

## 2017-07-23 DIAGNOSIS — J320 Chronic maxillary sinusitis: Secondary | ICD-10-CM | POA: Diagnosis not present

## 2017-08-14 ENCOUNTER — Encounter: Payer: Self-pay | Admitting: Neurology

## 2017-08-14 ENCOUNTER — Other Ambulatory Visit: Payer: Self-pay

## 2017-08-14 ENCOUNTER — Ambulatory Visit (INDEPENDENT_AMBULATORY_CARE_PROVIDER_SITE_OTHER): Payer: Medicare Other | Admitting: Neurology

## 2017-08-14 VITALS — BP 140/72 | HR 63 | Ht 69.0 in | Wt 179.0 lb

## 2017-08-14 DIAGNOSIS — F0391 Unspecified dementia with behavioral disturbance: Secondary | ICD-10-CM | POA: Diagnosis not present

## 2017-08-14 DIAGNOSIS — F03B18 Unspecified dementia, moderate, with other behavioral disturbance: Secondary | ICD-10-CM

## 2017-08-14 MED ORDER — DONEPEZIL HCL 10 MG PO TABS: 10.0000 mg | ORAL_TABLET | Freq: Every day | ORAL | 5 refills | Status: DC

## 2017-08-14 MED ORDER — QUETIAPINE FUMARATE 25 MG PO TABS: ORAL_TABLET | ORAL | 6 refills | Status: AC

## 2017-08-14 MED ORDER — MEMANTINE HCL 10 MG PO TABS: 10.0000 mg | ORAL_TABLET | Freq: Two times a day (BID) | ORAL | 5 refills | Status: AC

## 2017-08-14 NOTE — Progress Notes (Signed)
NEUROLOGY FOLLOW UP OFFICE NOTE  Travis Ware [id number]  DOB: 23-Feb-1930  HISTORY OF PRESENT ILLNESS: Travis Ware was seen in follow-up in the neurology clinic on 08/14/2017.  The patient was last seen 5 months ago for moderate dementia with behavioral disturbance. His MMSE in October 2018 was 18/30 (18/30 in July 2017, 15/30 in November 2016). He is again accompanied by his wife and caregiver Travis Ware who help supplement the history today. On his last visit, Seroquel was added for behavioral difficulties, worse in the evening hours. Travis Ware comes from 9am to 5pm and administers his medications. She has been giving Seroquel 25mg  at 11am because of concern of alcohol intake starting late afternoon. He is taking Aricept 10mg  daily and Namenda 10mg  BID without side effects. His wife and Travis Ware report that he is more "mean" in the evening hours. It was difficult to obtain further information for the rest of the visit as the patient was becoming more agitated and kept repeating "this is the most ridiculous visit ever" and "why do I have to do this."   HPI: This is an 84 yo RH man with a history of hypertension who presented in 02/2014 for evaluation of memory loss.He had minimal insight into his condition, stating he did not know why he is being evaluated because there is no problem with his memory and that his wife "started all this." He tells me that he has been having problems with his marriage and that he has not been living with his wife. He states "I don't know that I really have a memory problem." He lives in a large property and reported he drives without getting lost. He denies any missed bill payments and denies missing medication doses. He has left the stove on a few times. He denies any family history of memory problems. 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 She lives in [place], however family members and friends have expressed concern.  They started noticing memory changes around 4 years ago, but a more rapid decline recently. He has told friends that he has gotten lost. His wife has breast cancer and he has become mean to her. His daughter had to remind him several times about the appointment, which he was resistant to going to. Per records, his wife had reported losing his keys, getting lost driving, not knowing what day of the week it is. He does not take care of his house or dog properly, does not launder his clothes. He scored 25/30 on MMSE with his PCP.  His family has been informing [name] that memory has been worsening, he was also having agitation. Ativan caused more agitation. He was started on Namenda 10mg  BID. He continues to report there is "nothing wrong with him," but realizing that his memory is "fair, not as good as it has been."   I personally reviewed MRI brain without contrast done 03/2015 which did not show any acute changes, there was mild diffuse volume loss, mild microvascular disease.   PAST MEDICAL HISTORY: Past Medical History:  Diagnosis Date  . Chickenpox    as a child   . GERD (gastroesophageal reflux disease)   . Hypertension   . Measles    at age 84    MEDICATIONS:  Outpatient Encounter Medications as of 08/14/2017  Medication Sig  . donepezil (ARICEPT) 10 MG tablet Take 1 tablet (10 mg total) by mouth daily.  Marland Kitchen losartan-hydrochlorothiazide (HYZAAR) 50-12.5  MG tablet TAKE 1 TABLET ONCE DAILY.  . memantine (NAMENDA) 10 MG tablet Take 1 tablet (10 mg total) by mouth 2 (two) times daily.  . QUEtiapine (SEROQUEL) 25 MG tablet Take 1/2 tablet at night for a week, then increase to 1/2 tablet twice a day (Patient taking 1 tablet in AM)  . [DISCONTINUED] metoprolol succinate (TOPROL-XL) 50 MG 24 hr tablet Take 1 tablet (50 mg total) by mouth daily. Take with or immediately following a meal. (Patient taking differently: Take 25 mg by mouth daily. Take with or immediately following a meal.)   No  facility-administered encounter medications on file as of 08/14/2017.     ALLERGIES: No Known Allergies  FAMILY HISTORY: Family History  Problem Relation Age of Onset  . Stroke Mother   . Hypertension Father     SOCIAL HISTORY: Social History   Socioeconomic History  . Marital status: Married    Spouse name: Not on file  . Number of children: Not on file  . Years of education: Not on file  . Highest education level: Not on file  Social Needs  . Financial resource strain: Not on file  . Food insecurity - worry: Not on file  . Food insecurity - inability: Not on file  . Transportation needs - medical: Not on file  . Transportation needs - non-medical: Not on file  Occupational History  . Not on file  Tobacco Use  . Smoking status: Former Smoker    Last attempt to quit: 03/12/1994    Years since quitting: 23.4  . Smokeless tobacco: Never Used  Substance and Sexual Activity  . Alcohol use: Yes    Alcohol/week: 0.0 oz    Comment: weekly  . Drug use: No  . Sexual activity: Not on file  Other Topics Concern  . Not on file  Social History Narrative  . Not on file    REVIEW OF SYSTEMS unable to obtain, patient uncooperative  PHYSICAL EXAM: Vitals:   08/14/17 0830  BP: 140/72  Pulse: 63  SpO2: 96%   General: No acute distress, became increasingly agitated during the visit when MMSE was performed Head:  Normocephalic/atraumatic Neck: supple, no paraspinal tenderness, full range of motion Heart:  Regular rate and rhythm Lungs:  Clear to auscultation bilaterally Back: No paraspinal tenderness Skin/Extremities: No rash, no edema Neurological Exam: alert and oriented to person, city/state. No aphasia or dysarthria. Fund of knowledge is reduced. Recent and remote memory impaired. 0/3 delayed recall. Attention and concentration are reduced.    Able to name objects and repeat phrases. Unable to complete MMSE, he became increasingly agitated and kept saying "this is  ridiculous, explain to me why I have to do this, why am I here" several times. Cranial nerves: Pupils equal, round, reactive to light. Extraocular movements intact with no nystagmus. Visual fields full. Facial sensation intact. No facial asymmetry. Tongue, uvula, palate midline.  Motor: Bulk and tone normal, muscle strength 5/5 throughout with no pronator drift.  Sensation to light touch intact.  No extinction to double simultaneous stimulation.  Deep tendon reflexes 2+ throughout, toes downgoing.  Finger to nose testing intact.  Gait narrow-based and steady.  Romberg negative.  IMPRESSION: This is an 84 yo RH man with a history of hypertension with moderate dementia with behavioral disturbance, likely Alzheimer type. Unable to complete MMSE today, 0/3 delayed recall, oriented only to city/state, he became increasingly uncooperative and agitated saying this is the most ridiculous visit and why does he have to  be here. He is on Aricept 10mg  daily and Namenda 10mg  BID. I discussed with his wife and caregiver that the Seroquel is a low dose and can be increased for the behavioral changes, Travis Ware will start giving 1 tab in AM, 1 tab in afternoon before she leaves. They report wine intake starting at 4pm, she is unsure how much he drinks when she leaves. I discussed alcohol reduction with the patient. We discussed that coming to these visits are causing significant agitation for the patient, he is on Aricept and Namenda for dementia, nothing further can be offered from this standpoint. Discussed that he can follow-up with PCP for further titration of Seroquel, or potentially seeing a geriatric psychiatrist to manage the behavioral changes can be considered. He will follow-up on a prn basis.   Thank you for allowing me to participate in his care.  Please do not hesitate to call for any questions or concerns.  The duration of this appointment visit was 25 minutes of face-to-face time with the patient.  Greater than  50% of this time was spent in counseling, explanation of diagnosis, planning of further management, and coordination of care.   Ellouise Newer, M.D.   CC: Dr. Delfina Redwood

## 2017-08-14 NOTE — Patient Instructions (Signed)
1. Continue Aricept and Namenda 2. Increase Seroquel 25mg : take 1 tab in AM, 1 tab in afternoon 3. Follow-up with PCP

## 2017-08-19 DIAGNOSIS — E0590 Thyrotoxicosis, unspecified without thyrotoxic crisis or storm: Secondary | ICD-10-CM | POA: Diagnosis not present

## 2017-10-10 DIAGNOSIS — E042 Nontoxic multinodular goiter: Secondary | ICD-10-CM | POA: Diagnosis not present

## 2017-10-10 DIAGNOSIS — E0590 Thyrotoxicosis, unspecified without thyrotoxic crisis or storm: Secondary | ICD-10-CM | POA: Diagnosis not present

## 2017-10-10 DIAGNOSIS — Z5181 Encounter for therapeutic drug level monitoring: Secondary | ICD-10-CM | POA: Diagnosis not present

## 2017-12-16 DIAGNOSIS — Z0000 Encounter for general adult medical examination without abnormal findings: Secondary | ICD-10-CM | POA: Diagnosis not present

## 2017-12-16 DIAGNOSIS — G309 Alzheimer's disease, unspecified: Secondary | ICD-10-CM | POA: Diagnosis not present

## 2017-12-16 DIAGNOSIS — E0590 Thyrotoxicosis, unspecified without thyrotoxic crisis or storm: Secondary | ICD-10-CM | POA: Diagnosis not present

## 2017-12-16 DIAGNOSIS — J302 Other seasonal allergic rhinitis: Secondary | ICD-10-CM | POA: Diagnosis not present

## 2017-12-16 DIAGNOSIS — Z23 Encounter for immunization: Secondary | ICD-10-CM | POA: Diagnosis not present

## 2017-12-16 DIAGNOSIS — F0281 Dementia in other diseases classified elsewhere with behavioral disturbance: Secondary | ICD-10-CM | POA: Diagnosis not present

## 2017-12-16 DIAGNOSIS — I10 Essential (primary) hypertension: Secondary | ICD-10-CM | POA: Diagnosis not present

## 2017-12-16 DIAGNOSIS — Z1389 Encounter for screening for other disorder: Secondary | ICD-10-CM | POA: Diagnosis not present

## 2018-01-20 DIAGNOSIS — Z23 Encounter for immunization: Secondary | ICD-10-CM | POA: Diagnosis not present

## 2018-01-20 DIAGNOSIS — J3089 Other allergic rhinitis: Secondary | ICD-10-CM | POA: Diagnosis not present

## 2018-01-20 DIAGNOSIS — Z6828 Body mass index (BMI) 28.0-28.9, adult: Secondary | ICD-10-CM | POA: Diagnosis not present

## 2018-01-20 DIAGNOSIS — I10 Essential (primary) hypertension: Secondary | ICD-10-CM | POA: Diagnosis not present

## 2018-01-20 DIAGNOSIS — F0390 Unspecified dementia without behavioral disturbance: Secondary | ICD-10-CM | POA: Diagnosis not present

## 2018-01-29 DIAGNOSIS — D0461 Carcinoma in situ of skin of right upper limb, including shoulder: Secondary | ICD-10-CM | POA: Diagnosis not present

## 2018-01-29 DIAGNOSIS — D485 Neoplasm of uncertain behavior of skin: Secondary | ICD-10-CM | POA: Diagnosis not present

## 2018-02-28 DIAGNOSIS — Z0289 Encounter for other administrative examinations: Secondary | ICD-10-CM | POA: Diagnosis not present

## 2018-02-28 DIAGNOSIS — F0390 Unspecified dementia without behavioral disturbance: Secondary | ICD-10-CM | POA: Diagnosis not present

## 2018-02-28 DIAGNOSIS — I10 Essential (primary) hypertension: Secondary | ICD-10-CM | POA: Diagnosis not present

## 2018-02-28 DIAGNOSIS — Z6828 Body mass index (BMI) 28.0-28.9, adult: Secondary | ICD-10-CM | POA: Diagnosis not present

## 2018-03-20 DIAGNOSIS — Z23 Encounter for immunization: Secondary | ICD-10-CM | POA: Diagnosis not present

## 2018-03-20 DIAGNOSIS — D0461 Carcinoma in situ of skin of right upper limb, including shoulder: Secondary | ICD-10-CM | POA: Diagnosis not present

## 2018-05-13 DIAGNOSIS — M25512 Pain in left shoulder: Secondary | ICD-10-CM | POA: Diagnosis not present

## 2018-06-12 DIAGNOSIS — E0590 Thyrotoxicosis, unspecified without thyrotoxic crisis or storm: Secondary | ICD-10-CM | POA: Diagnosis not present

## 2018-06-12 DIAGNOSIS — E538 Deficiency of other specified B group vitamins: Secondary | ICD-10-CM | POA: Diagnosis not present

## 2018-06-12 DIAGNOSIS — J3089 Other allergic rhinitis: Secondary | ICD-10-CM | POA: Diagnosis not present

## 2018-06-12 DIAGNOSIS — F0390 Unspecified dementia without behavioral disturbance: Secondary | ICD-10-CM | POA: Diagnosis not present

## 2018-06-12 DIAGNOSIS — I10 Essential (primary) hypertension: Secondary | ICD-10-CM | POA: Diagnosis not present

## 2018-06-12 DIAGNOSIS — D7589 Other specified diseases of blood and blood-forming organs: Secondary | ICD-10-CM | POA: Diagnosis not present

## 2018-06-12 DIAGNOSIS — E559 Vitamin D deficiency, unspecified: Secondary | ICD-10-CM | POA: Diagnosis not present

## 2018-06-12 DIAGNOSIS — R1319 Other dysphagia: Secondary | ICD-10-CM | POA: Diagnosis not present

## 2018-06-12 DIAGNOSIS — Z6827 Body mass index (BMI) 27.0-27.9, adult: Secondary | ICD-10-CM | POA: Diagnosis not present

## 2018-07-08 ENCOUNTER — Other Ambulatory Visit: Payer: Self-pay | Admitting: Family Medicine

## 2018-07-08 DIAGNOSIS — E538 Deficiency of other specified B group vitamins: Secondary | ICD-10-CM | POA: Diagnosis not present

## 2018-07-15 DIAGNOSIS — E538 Deficiency of other specified B group vitamins: Secondary | ICD-10-CM | POA: Diagnosis not present

## 2018-07-16 ENCOUNTER — Other Ambulatory Visit: Payer: Self-pay | Admitting: Neurology

## 2018-07-16 NOTE — Telephone Encounter (Signed)
Pls have them send Rx to his PCP, he is not following in our office any longer. Thanks

## 2018-07-16 NOTE — Telephone Encounter (Signed)
[facility] asking them to Cx Aricept Rx.  Also advised that pt is no longer under Dr. Amparo Bristol care and they should contact PCP from now on.

## 2018-07-16 NOTE — Telephone Encounter (Signed)
Was not sure if you are still handling pt's dementia medications - he is f/u [facility] now

## 2018-07-23 DIAGNOSIS — E538 Deficiency of other specified B group vitamins: Secondary | ICD-10-CM | POA: Diagnosis not present

## 2018-07-30 DIAGNOSIS — J069 Acute upper respiratory infection, unspecified: Secondary | ICD-10-CM | POA: Diagnosis not present

## 2018-07-30 DIAGNOSIS — J309 Allergic rhinitis, unspecified: Secondary | ICD-10-CM | POA: Diagnosis not present

## 2018-07-30 DIAGNOSIS — D7589 Other specified diseases of blood and blood-forming organs: Secondary | ICD-10-CM | POA: Diagnosis not present

## 2018-07-30 DIAGNOSIS — R52 Pain, unspecified: Secondary | ICD-10-CM | POA: Diagnosis not present

## 2018-07-30 DIAGNOSIS — R509 Fever, unspecified: Secondary | ICD-10-CM | POA: Diagnosis not present

## 2018-07-30 DIAGNOSIS — E538 Deficiency of other specified B group vitamins: Secondary | ICD-10-CM | POA: Diagnosis not present

## 2018-09-01 DIAGNOSIS — D7589 Other specified diseases of blood and blood-forming organs: Secondary | ICD-10-CM | POA: Diagnosis not present

## 2018-10-10 DIAGNOSIS — E559 Vitamin D deficiency, unspecified: Secondary | ICD-10-CM | POA: Diagnosis not present

## 2018-10-10 DIAGNOSIS — F0390 Unspecified dementia without behavioral disturbance: Secondary | ICD-10-CM | POA: Diagnosis not present

## 2018-10-10 DIAGNOSIS — R1310 Dysphagia, unspecified: Secondary | ICD-10-CM | POA: Diagnosis not present

## 2018-10-10 DIAGNOSIS — I10 Essential (primary) hypertension: Secondary | ICD-10-CM | POA: Diagnosis not present

## 2018-10-10 DIAGNOSIS — J309 Allergic rhinitis, unspecified: Secondary | ICD-10-CM | POA: Diagnosis not present

## 2018-10-10 DIAGNOSIS — E0590 Thyrotoxicosis, unspecified without thyrotoxic crisis or storm: Secondary | ICD-10-CM | POA: Diagnosis not present

## 2018-10-10 DIAGNOSIS — E538 Deficiency of other specified B group vitamins: Secondary | ICD-10-CM | POA: Diagnosis not present

## 2018-10-29 DIAGNOSIS — E538 Deficiency of other specified B group vitamins: Secondary | ICD-10-CM | POA: Diagnosis not present

## 2019-01-19 DIAGNOSIS — D7589 Other specified diseases of blood and blood-forming organs: Secondary | ICD-10-CM | POA: Diagnosis not present

## 2019-02-10 DIAGNOSIS — J309 Allergic rhinitis, unspecified: Secondary | ICD-10-CM | POA: Diagnosis not present

## 2019-02-10 DIAGNOSIS — Z23 Encounter for immunization: Secondary | ICD-10-CM | POA: Diagnosis not present

## 2019-02-10 DIAGNOSIS — E559 Vitamin D deficiency, unspecified: Secondary | ICD-10-CM | POA: Diagnosis not present

## 2019-02-10 DIAGNOSIS — F0390 Unspecified dementia without behavioral disturbance: Secondary | ICD-10-CM | POA: Diagnosis not present

## 2019-02-10 DIAGNOSIS — E0590 Thyrotoxicosis, unspecified without thyrotoxic crisis or storm: Secondary | ICD-10-CM | POA: Diagnosis not present

## 2019-02-10 DIAGNOSIS — E538 Deficiency of other specified B group vitamins: Secondary | ICD-10-CM | POA: Diagnosis not present

## 2019-02-10 DIAGNOSIS — I10 Essential (primary) hypertension: Secondary | ICD-10-CM | POA: Diagnosis not present

## 2019-02-20 DIAGNOSIS — R05 Cough: Secondary | ICD-10-CM | POA: Diagnosis not present

## 2019-02-20 DIAGNOSIS — J069 Acute upper respiratory infection, unspecified: Secondary | ICD-10-CM | POA: Diagnosis not present

## 2019-03-09 DIAGNOSIS — R05 Cough: Secondary | ICD-10-CM | POA: Diagnosis not present

## 2019-03-17 DIAGNOSIS — D7589 Other specified diseases of blood and blood-forming organs: Secondary | ICD-10-CM | POA: Diagnosis not present

## 2019-06-12 DIAGNOSIS — E0590 Thyrotoxicosis, unspecified without thyrotoxic crisis or storm: Secondary | ICD-10-CM | POA: Diagnosis not present

## 2019-06-12 DIAGNOSIS — J309 Allergic rhinitis, unspecified: Secondary | ICD-10-CM | POA: Diagnosis not present

## 2019-06-12 DIAGNOSIS — F0390 Unspecified dementia without behavioral disturbance: Secondary | ICD-10-CM | POA: Diagnosis not present

## 2019-06-12 DIAGNOSIS — I10 Essential (primary) hypertension: Secondary | ICD-10-CM | POA: Diagnosis not present

## 2019-06-12 DIAGNOSIS — E538 Deficiency of other specified B group vitamins: Secondary | ICD-10-CM | POA: Diagnosis not present

## 2019-06-16 DIAGNOSIS — E538 Deficiency of other specified B group vitamins: Secondary | ICD-10-CM | POA: Diagnosis not present

## 2019-07-22 DIAGNOSIS — Z23 Encounter for immunization: Secondary | ICD-10-CM | POA: Diagnosis not present

## 2019-08-06 DIAGNOSIS — M25512 Pain in left shoulder: Secondary | ICD-10-CM | POA: Diagnosis not present

## 2019-08-12 DIAGNOSIS — D7589 Other specified diseases of blood and blood-forming organs: Secondary | ICD-10-CM | POA: Diagnosis not present

## 2019-08-19 DIAGNOSIS — Z23 Encounter for immunization: Secondary | ICD-10-CM | POA: Diagnosis not present

## 2019-10-22 DIAGNOSIS — R05 Cough: Secondary | ICD-10-CM | POA: Diagnosis not present

## 2019-10-22 DIAGNOSIS — E0590 Thyrotoxicosis, unspecified without thyrotoxic crisis or storm: Secondary | ICD-10-CM | POA: Diagnosis not present

## 2019-10-22 DIAGNOSIS — E559 Vitamin D deficiency, unspecified: Secondary | ICD-10-CM | POA: Diagnosis not present

## 2019-10-22 DIAGNOSIS — F0390 Unspecified dementia without behavioral disturbance: Secondary | ICD-10-CM | POA: Diagnosis not present

## 2019-10-22 DIAGNOSIS — I10 Essential (primary) hypertension: Secondary | ICD-10-CM | POA: Diagnosis not present

## 2019-10-22 DIAGNOSIS — R1310 Dysphagia, unspecified: Secondary | ICD-10-CM | POA: Diagnosis not present

## 2019-10-22 DIAGNOSIS — Z79899 Other long term (current) drug therapy: Secondary | ICD-10-CM | POA: Diagnosis not present

## 2019-10-22 DIAGNOSIS — J309 Allergic rhinitis, unspecified: Secondary | ICD-10-CM | POA: Diagnosis not present

## 2019-10-22 DIAGNOSIS — E538 Deficiency of other specified B group vitamins: Secondary | ICD-10-CM | POA: Diagnosis not present

## 2019-12-08 DIAGNOSIS — E538 Deficiency of other specified B group vitamins: Secondary | ICD-10-CM | POA: Diagnosis not present

## 2020-02-05 DIAGNOSIS — R2689 Other abnormalities of gait and mobility: Secondary | ICD-10-CM | POA: Diagnosis not present

## 2020-02-05 DIAGNOSIS — R296 Repeated falls: Secondary | ICD-10-CM | POA: Diagnosis not present

## 2020-02-05 DIAGNOSIS — F0390 Unspecified dementia without behavioral disturbance: Secondary | ICD-10-CM | POA: Diagnosis not present

## 2020-02-08 DIAGNOSIS — R2689 Other abnormalities of gait and mobility: Secondary | ICD-10-CM | POA: Diagnosis not present

## 2020-02-08 DIAGNOSIS — D519 Vitamin B12 deficiency anemia, unspecified: Secondary | ICD-10-CM | POA: Diagnosis not present

## 2020-02-08 DIAGNOSIS — I10 Essential (primary) hypertension: Secondary | ICD-10-CM | POA: Diagnosis not present

## 2020-02-08 DIAGNOSIS — F0390 Unspecified dementia without behavioral disturbance: Secondary | ICD-10-CM | POA: Diagnosis not present

## 2020-02-08 DIAGNOSIS — R296 Repeated falls: Secondary | ICD-10-CM | POA: Diagnosis not present

## 2020-02-09 DIAGNOSIS — E538 Deficiency of other specified B group vitamins: Secondary | ICD-10-CM | POA: Diagnosis not present

## 2020-03-07 ENCOUNTER — Emergency Department (HOSPITAL_BASED_OUTPATIENT_CLINIC_OR_DEPARTMENT_OTHER): Payer: Medicare Other

## 2020-03-07 ENCOUNTER — Other Ambulatory Visit: Payer: Self-pay

## 2020-03-07 ENCOUNTER — Encounter (HOSPITAL_BASED_OUTPATIENT_CLINIC_OR_DEPARTMENT_OTHER): Payer: Self-pay | Admitting: *Deleted

## 2020-03-07 ENCOUNTER — Emergency Department (HOSPITAL_BASED_OUTPATIENT_CLINIC_OR_DEPARTMENT_OTHER)
Admission: EM | Admit: 2020-03-07 | Discharge: 2020-03-07 | Disposition: A | Discharge status: Home / Self Care | Payer: Medicare Other | Attending: Emergency Medicine | Admitting: Emergency Medicine

## 2020-03-07 DIAGNOSIS — S80211A Abrasion, right knee, initial encounter: Secondary | ICD-10-CM | POA: Insufficient documentation

## 2020-03-07 DIAGNOSIS — R531 Weakness: Secondary | ICD-10-CM | POA: Diagnosis not present

## 2020-03-07 DIAGNOSIS — W19XXXA Unspecified fall, initial encounter: Secondary | ICD-10-CM | POA: Insufficient documentation

## 2020-03-07 DIAGNOSIS — Z79899 Other long term (current) drug therapy: Secondary | ICD-10-CM | POA: Insufficient documentation

## 2020-03-07 DIAGNOSIS — R945 Abnormal results of liver function studies: Secondary | ICD-10-CM | POA: Diagnosis not present

## 2020-03-07 DIAGNOSIS — R4182 Altered mental status, unspecified: Secondary | ICD-10-CM | POA: Diagnosis not present

## 2020-03-07 DIAGNOSIS — Z20822 Contact with and (suspected) exposure to covid-19: Secondary | ICD-10-CM | POA: Diagnosis not present

## 2020-03-07 DIAGNOSIS — S0990XA Unspecified injury of head, initial encounter: Secondary | ICD-10-CM | POA: Diagnosis not present

## 2020-03-07 DIAGNOSIS — S80212A Abrasion, left knee, initial encounter: Secondary | ICD-10-CM | POA: Diagnosis not present

## 2020-03-07 DIAGNOSIS — F0390 Unspecified dementia without behavioral disturbance: Secondary | ICD-10-CM | POA: Diagnosis not present

## 2020-03-07 DIAGNOSIS — Z7401 Bed confinement status: Secondary | ICD-10-CM | POA: Diagnosis not present

## 2020-03-07 DIAGNOSIS — I10 Essential (primary) hypertension: Secondary | ICD-10-CM | POA: Diagnosis not present

## 2020-03-07 DIAGNOSIS — S8992XA Unspecified injury of left lower leg, initial encounter: Secondary | ICD-10-CM | POA: Diagnosis present

## 2020-03-07 DIAGNOSIS — R52 Pain, unspecified: Secondary | ICD-10-CM | POA: Diagnosis not present

## 2020-03-07 DIAGNOSIS — M2550 Pain in unspecified joint: Secondary | ICD-10-CM | POA: Diagnosis not present

## 2020-03-07 DIAGNOSIS — M25461 Effusion, right knee: Secondary | ICD-10-CM | POA: Diagnosis not present

## 2020-03-07 DIAGNOSIS — M25462 Effusion, left knee: Secondary | ICD-10-CM | POA: Diagnosis not present

## 2020-03-07 DIAGNOSIS — Z043 Encounter for examination and observation following other accident: Secondary | ICD-10-CM | POA: Diagnosis not present

## 2020-03-07 DIAGNOSIS — R197 Diarrhea, unspecified: Secondary | ICD-10-CM | POA: Diagnosis not present

## 2020-03-07 DIAGNOSIS — Z87891 Personal history of nicotine dependence: Secondary | ICD-10-CM | POA: Insufficient documentation

## 2020-03-07 LAB — CBC WITH DIFFERENTIAL/PLATELET
Abs Immature Granulocytes: 0.32 10*3/uL — ABNORMAL HIGH (ref 0.00–0.07)
Basophils Absolute: 0.1 10*3/uL (ref 0.0–0.1)
Basophils Relative: 1 %
Eosinophils Absolute: 0 10*3/uL (ref 0.0–0.5)
Eosinophils Relative: 0 %
HCT: 48.2 % (ref 39.0–52.0)
Hemoglobin: 16.2 g/dL (ref 13.0–17.0)
Immature Granulocytes: 2 %
Lymphocytes Relative: 9 %
Lymphs Abs: 1.5 10*3/uL (ref 0.7–4.0)
MCH: 31.4 pg (ref 26.0–34.0)
MCHC: 33.6 g/dL (ref 30.0–36.0)
MCV: 93.4 fL (ref 80.0–100.0)
Monocytes Absolute: 1.6 10*3/uL — ABNORMAL HIGH (ref 0.1–1.0)
Monocytes Relative: 10 %
Neutro Abs: 12.9 10*3/uL — ABNORMAL HIGH (ref 1.7–7.7)
Neutrophils Relative %: 78 %
Platelets: 208 10*3/uL (ref 150–400)
RBC: 5.16 MIL/uL (ref 4.22–5.81)
RDW: 12.8 % (ref 11.5–15.5)
WBC: 16.5 10*3/uL — ABNORMAL HIGH (ref 4.0–10.5)
nRBC: 0 % (ref 0.0–0.2)

## 2020-03-07 LAB — COMPREHENSIVE METABOLIC PANEL
ALT: 86 U/L — ABNORMAL HIGH (ref 0–44)
AST: 416 U/L — ABNORMAL HIGH (ref 15–41)
Albumin: 3.8 g/dL (ref 3.5–5.0)
Alkaline Phosphatase: 75 U/L (ref 38–126)
Anion gap: 14 (ref 5–15)
BUN: 19 mg/dL (ref 8–23)
CO2: 27 mmol/L (ref 22–32)
Calcium: 9.1 mg/dL (ref 8.9–10.3)
Chloride: 93 mmol/L — ABNORMAL LOW (ref 98–111)
Creatinine, Ser: 1.25 mg/dL — ABNORMAL HIGH (ref 0.61–1.24)
GFR, Estimated: 51 mL/min — ABNORMAL LOW (ref >60)
Glucose, Bld: 101 mg/dL — ABNORMAL HIGH (ref 70–99)
Potassium: 3.3 mmol/L — ABNORMAL LOW (ref 3.5–5.1)
Sodium: 134 mmol/L — ABNORMAL LOW (ref 135–145)
Total Bilirubin: 1.8 mg/dL — ABNORMAL HIGH (ref 0.3–1.2)
Total Protein: 7.2 g/dL (ref 6.5–8.1)

## 2020-03-07 LAB — URINALYSIS, ROUTINE W REFLEX MICROSCOPIC
Bilirubin Urine: NEGATIVE
Glucose, UA: NEGATIVE mg/dL
Ketones, ur: NEGATIVE mg/dL
Leukocytes,Ua: NEGATIVE
Nitrite: NEGATIVE
Protein, ur: 100 mg/dL — AB
Specific Gravity, Urine: 1.025 (ref 1.005–1.030)
pH: 6 (ref 5.0–8.0)

## 2020-03-07 LAB — RESPIRATORY PANEL BY RT PCR (FLU A&B, COVID)
Influenza A by PCR: NEGATIVE
Influenza B by PCR: NEGATIVE
SARS Coronavirus 2 by RT PCR: NEGATIVE

## 2020-03-07 LAB — URINALYSIS, MICROSCOPIC (REFLEX): RBC / HPF: >50 RBC/hpf (ref 0–5)

## 2020-03-07 MED ORDER — SODIUM CHLORIDE 0.9 % IV BOLUS
500.0000 mL | Freq: Once | INTRAVENOUS | Status: AC
  Administered 2020-03-07: 500 mL via INTRAVENOUS

## 2020-03-07 MED ORDER — LORAZEPAM 1 MG PO TABS
0.5000 mg | ORAL_TABLET | Freq: Once | ORAL | Status: AC
  Administered 2020-03-07: 0.5 mg via ORAL
  Filled 2020-03-07: qty 1

## 2020-03-07 NOTE — ED Notes (Signed)
Family at bedside. 

## 2020-03-07 NOTE — ED Notes (Signed)
Per wife pt was able to walk on Sunday  Unable to do so know  Rt knee swollen  She states mentally is same as before fall

## 2020-03-07 NOTE — ED Notes (Addendum)
[facility] called for transport home. 

## 2020-03-07 NOTE — TOC Initial Note (Signed)
Transition of Care [facility]) - Initial/Assessment Note    Patient Details  Name: Travis Ware MRN: [id number] Date of Birth: 18-Mar-1930  [facility] CM/SW Contact:    Erenest Rasher, RN Phone Number: [phone] 03/07/2020, 4:39 PM  Clinical Narrative:                 TOC CM spoke to pt's caregiver, Dayton Martes. States pt has 24 hour caregiver in the home. Pt was ambulatory prior to admission. [name] is requesting hospital bed, wheelchair, and bedside commode. ED provider updated and DME ordered. Offered choice for [facility]. She is requesting [facility]. Contacted [facility] rep with new referral. [place] with new referral for DME.   Expected Discharge Plan: [place] [facility] Barriers to Discharge: Continued Medical Work up   Patient Goals and CMS Choice Patient states their goals for this hospitalization and ongoing recovery are:: has 24 hour caregivers CMS Medicare.gov Compare Post Acute Care list provided to:: Patient Represenative (must comment) Donne Anon) Choice offered to / list presented to : [place] / Guardian  Expected Discharge Plan and Services Expected Discharge Plan: [place] In-house Referral: Clinical Social Work Discharge Planning Services: CM Consult Post Acute Care Choice: [place] arrangements for the past 2 months: [facility]                 DME Arranged: 3-N-1, Youth worker wheelchair with seat cushion, Hospital bed DME Agency: [employer] Date DME Agency Contacted: 03/07/20 Time DME Agency Contacted: [phone] Representative spoke with at DME Agency: Andree Coss HH Arranged: RN, PT, OT, Nurse's Aide, Social Work [employer] Agency: [place] Date [place]: 03/07/20 Time [place]: 1638 Representative spoke with at [place]: Adela Lank  Prior Living Arrangements/Services Living arrangements for the past 2 months: [place]  with:: Self Patient language and need for interpreter reviewed:: Yes Do you feel safe going back to the place where you live?: Yes      Need for Family Participation in Patient Care: Yes (Comment) Care giver support system in place?: Yes (comment) Current home services: Homehealth aide (has 24 hour caregivers in the home) Criminal Activity/Legal Involvement Pertinent to Current Situation/Hospitalization: No - Comment as needed  Activities of Daily Living      Permission Sought/Granted Permission sought to share information with : Case Manager, PCP, Family Supports Permission granted to share information with : Yes, Verbal Permission Granted  Share Information with NAME: Dayton Martes  Permission granted to share info w AGENCY: [place], DME  Permission granted to share info w Relationship: caregiver  Permission granted to share info w Contact Information: [phone]  Emotional Assessment       Orientation: : Oriented to Self   Psych Involvement: No (comment)  Admission diagnosis:  Head injury, non ambulatory Patient Active Problem List   Diagnosis Date Noted  . Moderate dementia ([place]) 03/29/2015  . Cataracts, bilateral 03/28/2015  . Hyperkeratosis 09/01/2014  . Memory loss 02/08/2014  . DYSPNEA 08/06/2008  . DYSPNEA ON EXERTION 07/21/2008  . EDEMA 10/24/2007  . HYPERTENSION, BENIGN 03/25/2007  . GERD 03/25/2007  . DIZZINESS, CHRONIC 03/25/2007   PCP:  Aura Dials, MD Pharmacy:   [facility], [address] [place] [address] Phone: [phone] Fax: [phone]     Social Determinants of Health (SDOH) Interventions  Readmission Risk Interventions No flowsheet data found.

## 2020-03-07 NOTE — ED Provider Notes (Signed)
Patient signed out to me at 3 PM.  Patient with fall several days ago.  Bilateral knee effusions.  Does have 24/7 caregiver at home but having difficulty with ambulation due to pain in his knees.  Does not have any DME equipment at home.  Lab work overall unremarkable except for mild elevation in liver enzymes.  However CT scan of the abdomen and pelvis overall unremarkable.  No acute findings in the hepatobiliary area.  No obvious infectious process.  Does not have any abdominal pain.  Has had some intermittent diarrhea but none currently.  No nausea, no vomiting.  Overall patient appears well.  Suspected left hand fracture on CT abdomen and pelvis was possibly picked up however patient does not have any hand tenderness on exam.  Otherwise images show no acute injuries.  No fractures.  Suspect ambulation issues are from knee effusions and contusions.  Social work was consulted and we have arranged for wheelchair, hospital bed, home physical therapy and OT.  Patient does have 24 7 care already in place as well.  We will have him follow-up to have liver enzymes rechecked by primary care doctor.  He understands return to ED if symptoms worsen.  This chart was dictated using voice recognition software.  Despite best efforts to proofread,  errors can occur which can change the documentation meaning.     Lennice Sites, DO 03/07/20 1650

## 2020-03-07 NOTE — ED Notes (Addendum)
Covid Swab obtained and to the lab 

## 2020-03-07 NOTE — ED Provider Notes (Signed)
[facility] EMERGENCY DEPARTMENT Provider Note   CSN: [id number] Arrival date & time: 03/07/20  1202     History Chief Complaint  Patient presents with  . Fall    Travis Ware is a 86 y.o. male.  Patient is a 86 year old male with history of dementia, hypertension and GERD who presents after a fall.  He has a full-time caregiver at home.  Per the caregiver, he has been a little bit weaker over the last few days and has had some diarrhea.  No vomiting.  No cough.  No complaints of abdominal pain.  No change in mental status.  He did have a fall yesterday.  He apparently fell down onto his knees.  Its unclear what initiated the fall.  He has some abrasions to his knees.  He did not have any other apparent injuries.  Since that time, he has not been able to ambulate.  He has had no reported fevers.  Apparently his urine has got a foul odor.  History is limited due to his dementia.        Past Medical History:  Diagnosis Date  . Chickenpox    as a child   . GERD (gastroesophageal reflux disease)   . Hypertension   . Measles    at age 86    Patient Active Problem List   Diagnosis Date Noted  . Moderate dementia ([place]) 03/29/2015  . Cataracts, bilateral 03/28/2015  . Hyperkeratosis 09/01/2014  . Memory loss 02/08/2014  . DYSPNEA 08/06/2008  . DYSPNEA ON EXERTION 07/21/2008  . EDEMA 10/24/2007  . HYPERTENSION, BENIGN 03/25/2007  . GERD 03/25/2007  . DIZZINESS, CHRONIC 03/25/2007    Past Surgical History:  Procedure Laterality Date  . TONSILLECTOMY         Family History  Problem Relation Age of Onset  . Stroke Mother   . Hypertension Father     Social History   Tobacco Use  . Smoking status: Former Smoker    Quit date: 03/12/1994    Years since quitting: 26.0  . Smokeless tobacco: Never Used  Substance Use Topics  . Alcohol use: Yes    Alcohol/week: 0.0 standard drinks    Comment: weekly  . Drug use: No    Home Medications Prior to  Admission medications   Medication Sig Start Date End Date Taking? Authorizing Provider  donepezil (ARICEPT) 10 MG tablet Take 1 tablet (10 mg total) by mouth daily. 07/16/18   Cameron Sprang, MD  losartan-hydrochlorothiazide (HYZAAR) 50-12.5 MG tablet TAKE 1 TABLET ONCE DAILY. 11/01/15   Laurey Morale, MD  memantine (NAMENDA) 10 MG tablet Take 1 tablet (10 mg total) by mouth 2 (two) times daily. 08/14/17   Cameron Sprang, MD  QUEtiapine (SEROQUEL) 25 MG tablet Take 1 tablet in AM, 1 tablet in afternoon 08/14/17   Cameron Sprang, MD    Allergies    Patient has no known allergies.  Review of Systems   Review of Systems  Unable to perform ROS: Dementia    Physical Exam Updated Vital Signs BP (!) 156/121 (BP Location: Right Arm)   Pulse 79   Temp 98.6 F (37 C) (Oral)   Resp 16   Wt 75.5 kg   SpO2 99%   BMI 24.57 kg/m   Physical Exam Constitutional:      Appearance: He is well-developed.  HENT:     Head: Normocephalic and atraumatic.  Eyes:     Pupils: Pupils are equal, round, and  reactive to light.  Neck:     Comments: No pain to the cervical, thoracic or lumbosacral spine Cardiovascular:     Rate and Rhythm: Normal rate and regular rhythm.     Heart sounds: Normal heart sounds.  Pulmonary:     Effort: Pulmonary effort is normal. No respiratory distress.     Breath sounds: Normal breath sounds. No wheezing or rales.  Chest:     Chest wall: No tenderness.  Abdominal:     General: Bowel sounds are normal.     Palpations: Abdomen is soft.     Tenderness: There is no abdominal tenderness. There is no guarding or rebound.  Musculoskeletal:        General: Normal range of motion.     Cervical back: Normal range of motion and neck supple.     Comments: There are some abrasions to the anterior surfaces of both knees, just distal to the patellas.  There are some mild swelling in both knees.  There is no significant pain on range of motion of the hips, knees or ankles.  Pedal  pulses are intact.  Lymphadenopathy:     Cervical: No cervical adenopathy.  Skin:    General: Skin is warm and dry.     Findings: No rash.  Neurological:     Mental Status: He is alert.     Comments: Patient is oriented to person only.  He is moving all extremities symmetrically without focal deficits.     ED Results / Procedures / Treatments   Labs (all labs ordered are listed, but only abnormal results are displayed) Labs Reviewed  COMPREHENSIVE METABOLIC PANEL - Abnormal; Notable for the following components:      Result Value   Sodium 134 (*)    Potassium 3.3 (*)    Chloride 93 (*)    Glucose, Bld 101 (*)    Creatinine, Ser 1.25 (*)    AST 416 (*)    ALT 86 (*)    Total Bilirubin 1.8 (*)    GFR, Estimated 51 (*)    All other components within normal limits  CBC WITH DIFFERENTIAL/PLATELET - Abnormal; Notable for the following components:   WBC 16.5 (*)    Neutro Abs 12.9 (*)    Monocytes Absolute 1.6 (*)    Abs Immature Granulocytes 0.32 (*)    All other components within normal limits  RESPIRATORY PANEL BY RT PCR (FLU A&B, COVID)  URINALYSIS, ROUTINE W REFLEX MICROSCOPIC    EKG EKG Interpretation  Date/Time:  Monday March 07 2020 13:42:38 EDT Ventricular Rate:  81 PR Interval:    QRS Duration: 82 QT Interval:  371 QTC Calculation: 431 R Axis:   81 Text Interpretation: Sinus rhythm Consider left atrial enlargement Borderline right axis deviation No old tracing to compare Confirmed by Malvin Johns [id number]) on 03/07/2020 1:46:38 PM   Radiology No results found.  Procedures Procedures (including critical care time)  Medications Ordered in ED Medications - No data to display  ED Course  I have reviewed the triage vital signs and the nursing notes.  Pertinent labs & imaging results that were available during my care of the patient were reviewed by me and considered in my medical decision making (see chart for details).    MDM Rules/Calculators/A&P                           Patient is a 86 year old male who presents with weakness and a  fall.  He has been unable to ambulate since the fall.  His labs show an elevation in his WBC count and also elevated liver enzymes.  I do not appreciate specific abdominal tenderness however given the diarrhea and elevated LFTs, I will image his abdomen.  He also has an acute kidney injury.  We are unable to get ultrasound so I will order a CT scan.  He is pending CT scans and other imaging studies from his fall.  His urinalysis is also pending.  Dr. Ronnald Nian to take over care pending these studies. Final Clinical Impression(s) / ED Diagnoses Final diagnoses:  None    Rx / DC Orders ED Discharge Orders    None       Malvin Johns, MD 03/07/20 1448

## 2020-03-07 NOTE — ED Notes (Signed)
Peri Care Performed and IO cath performed with Sam RN as Chaperone.

## 2020-03-07 NOTE — ED Notes (Signed)
Micro meal given

## 2020-03-07 NOTE — ED Notes (Signed)
sw called to talk to care giver

## 2020-03-07 NOTE — Discharge Instructions (Addendum)
Recommend ice to bilateral knees 20 minutes on and as often as possible.  Recommend Tylenol for pain.  Please follow-up with your primary care doctor to have liver enzymes rechecked.  If he develops any worsening symptoms please return for evaluation.  There should be wheelchair and hospital bed ordered for you as well as physical therapy and home health.

## 2020-03-07 NOTE — ED Notes (Signed)
Patient transported to X-ray via stretcher, sr x 2 up, safety measures in place

## 2020-03-07 NOTE — ED Triage Notes (Signed)
Per EMS:  Pt fell yesterday morning, unwitnessed.  Pt found in floor, no c/o.  Abrasion to right knee.  Pt states hurts all over but has dementia, no changes in baseline.  Pt unable to ambulate.  Pt able to to stand but took a lot of help to transfer to stretcher.

## 2020-03-07 NOTE — Progress Notes (Signed)
.. ° °  °  Durable Medical Equipment  (From admission, onward)         Start     Ordered   03/07/20 1629  For home use only DME Hospital bed  Once       Question Answer Comment  Length of Need Lifetime   Patient has (list medical condition): fall, generalized weakness   The above medical condition requires: Patient requires the ability to reposition frequently   Head must be elevated greater than: Other see comments   Bed type Semi-electric   Hoyer Lift Yes   Trapeze Bar Yes   Support Surface: Gel Overlay      03/07/20 1633   03/07/20 1608  For home use only DME 3 n 1  Once        03/07/20 1607   03/07/20 1607  For home use only DME lightweight manual wheelchair with seat cushion  Once       Comments: Patient suffers from falls, generalized weakness which impairs their ability to perform daily activities like standing, walking, bathing in the home.  A rolling walker, cane will not resolve  issue with performing activities of daily living. A wheelchair will allow patient to safely perform daily activities. Patient is not able to propel themselves in the home using a standard weight wheelchair due to pain, weakness. Patient can self propel in the lightweight wheelchair. Length of need lifetime.  Accessories: elevating leg rests (ELRs), wheel locks, extensions and anti-tippers, back cushion.   03/07/20 1607

## 2020-03-12 DIAGNOSIS — Z87891 Personal history of nicotine dependence: Secondary | ICD-10-CM | POA: Diagnosis not present

## 2020-03-12 DIAGNOSIS — H269 Unspecified cataract: Secondary | ICD-10-CM | POA: Diagnosis not present

## 2020-03-12 DIAGNOSIS — Z9181 History of falling: Secondary | ICD-10-CM | POA: Diagnosis not present

## 2020-03-12 DIAGNOSIS — F32A Depression, unspecified: Secondary | ICD-10-CM | POA: Diagnosis not present

## 2020-03-12 DIAGNOSIS — K219 Gastro-esophageal reflux disease without esophagitis: Secondary | ICD-10-CM | POA: Diagnosis not present

## 2020-03-12 DIAGNOSIS — M25462 Effusion, left knee: Secondary | ICD-10-CM | POA: Diagnosis not present

## 2020-03-12 DIAGNOSIS — M25461 Effusion, right knee: Secondary | ICD-10-CM | POA: Diagnosis not present

## 2020-03-12 DIAGNOSIS — M170 Bilateral primary osteoarthritis of knee: Secondary | ICD-10-CM | POA: Diagnosis not present

## 2020-03-12 DIAGNOSIS — I10 Essential (primary) hypertension: Secondary | ICD-10-CM | POA: Diagnosis not present

## 2020-03-12 DIAGNOSIS — F0390 Unspecified dementia without behavioral disturbance: Secondary | ICD-10-CM | POA: Diagnosis not present

## 2020-03-12 DIAGNOSIS — F419 Anxiety disorder, unspecified: Secondary | ICD-10-CM | POA: Diagnosis not present

## 2020-03-12 DIAGNOSIS — N179 Acute kidney failure, unspecified: Secondary | ICD-10-CM | POA: Diagnosis not present

## 2020-03-14 DIAGNOSIS — F0390 Unspecified dementia without behavioral disturbance: Secondary | ICD-10-CM | POA: Diagnosis not present

## 2020-03-14 DIAGNOSIS — F419 Anxiety disorder, unspecified: Secondary | ICD-10-CM | POA: Diagnosis not present

## 2020-03-14 DIAGNOSIS — I10 Essential (primary) hypertension: Secondary | ICD-10-CM | POA: Diagnosis not present

## 2020-03-14 DIAGNOSIS — M170 Bilateral primary osteoarthritis of knee: Secondary | ICD-10-CM | POA: Diagnosis not present

## 2020-03-14 DIAGNOSIS — M25462 Effusion, left knee: Secondary | ICD-10-CM | POA: Diagnosis not present

## 2020-03-14 DIAGNOSIS — M25461 Effusion, right knee: Secondary | ICD-10-CM | POA: Diagnosis not present

## 2020-03-15 DIAGNOSIS — M25461 Effusion, right knee: Secondary | ICD-10-CM | POA: Diagnosis not present

## 2020-03-15 DIAGNOSIS — I10 Essential (primary) hypertension: Secondary | ICD-10-CM | POA: Diagnosis not present

## 2020-03-15 DIAGNOSIS — F0390 Unspecified dementia without behavioral disturbance: Secondary | ICD-10-CM | POA: Diagnosis not present

## 2020-03-15 DIAGNOSIS — M170 Bilateral primary osteoarthritis of knee: Secondary | ICD-10-CM | POA: Diagnosis not present

## 2020-03-15 DIAGNOSIS — M25462 Effusion, left knee: Secondary | ICD-10-CM | POA: Diagnosis not present

## 2020-03-15 DIAGNOSIS — F419 Anxiety disorder, unspecified: Secondary | ICD-10-CM | POA: Diagnosis not present

## 2020-03-17 DIAGNOSIS — M25461 Effusion, right knee: Secondary | ICD-10-CM | POA: Diagnosis not present

## 2020-03-17 DIAGNOSIS — R2689 Other abnormalities of gait and mobility: Secondary | ICD-10-CM | POA: Diagnosis not present

## 2020-03-17 DIAGNOSIS — J309 Allergic rhinitis, unspecified: Secondary | ICD-10-CM | POA: Diagnosis not present

## 2020-03-17 DIAGNOSIS — R1310 Dysphagia, unspecified: Secondary | ICD-10-CM | POA: Diagnosis not present

## 2020-03-17 DIAGNOSIS — E538 Deficiency of other specified B group vitamins: Secondary | ICD-10-CM | POA: Diagnosis not present

## 2020-03-17 DIAGNOSIS — M170 Bilateral primary osteoarthritis of knee: Secondary | ICD-10-CM | POA: Diagnosis not present

## 2020-03-17 DIAGNOSIS — F0390 Unspecified dementia without behavioral disturbance: Secondary | ICD-10-CM | POA: Diagnosis not present

## 2020-03-17 DIAGNOSIS — I10 Essential (primary) hypertension: Secondary | ICD-10-CM | POA: Diagnosis not present

## 2020-03-17 DIAGNOSIS — L89620 Pressure ulcer of left heel, unstageable: Secondary | ICD-10-CM | POA: Diagnosis not present

## 2020-03-17 DIAGNOSIS — E0590 Thyrotoxicosis, unspecified without thyrotoxic crisis or storm: Secondary | ICD-10-CM | POA: Diagnosis not present

## 2020-03-17 DIAGNOSIS — F419 Anxiety disorder, unspecified: Secondary | ICD-10-CM | POA: Diagnosis not present

## 2020-03-17 DIAGNOSIS — R197 Diarrhea, unspecified: Secondary | ICD-10-CM | POA: Diagnosis not present

## 2020-03-17 DIAGNOSIS — M25462 Effusion, left knee: Secondary | ICD-10-CM | POA: Diagnosis not present

## 2020-03-22 DIAGNOSIS — M170 Bilateral primary osteoarthritis of knee: Secondary | ICD-10-CM | POA: Diagnosis not present

## 2020-03-22 DIAGNOSIS — M25462 Effusion, left knee: Secondary | ICD-10-CM | POA: Diagnosis not present

## 2020-03-22 DIAGNOSIS — F419 Anxiety disorder, unspecified: Secondary | ICD-10-CM | POA: Diagnosis not present

## 2020-03-22 DIAGNOSIS — M25461 Effusion, right knee: Secondary | ICD-10-CM | POA: Diagnosis not present

## 2020-03-22 DIAGNOSIS — F0390 Unspecified dementia without behavioral disturbance: Secondary | ICD-10-CM | POA: Diagnosis not present

## 2020-03-22 DIAGNOSIS — I10 Essential (primary) hypertension: Secondary | ICD-10-CM | POA: Diagnosis not present

## 2020-03-24 DIAGNOSIS — M170 Bilateral primary osteoarthritis of knee: Secondary | ICD-10-CM | POA: Diagnosis not present

## 2020-03-24 DIAGNOSIS — F419 Anxiety disorder, unspecified: Secondary | ICD-10-CM | POA: Diagnosis not present

## 2020-03-24 DIAGNOSIS — F0390 Unspecified dementia without behavioral disturbance: Secondary | ICD-10-CM | POA: Diagnosis not present

## 2020-03-24 DIAGNOSIS — M25461 Effusion, right knee: Secondary | ICD-10-CM | POA: Diagnosis not present

## 2020-03-24 DIAGNOSIS — I10 Essential (primary) hypertension: Secondary | ICD-10-CM | POA: Diagnosis not present

## 2020-03-24 DIAGNOSIS — Z23 Encounter for immunization: Secondary | ICD-10-CM | POA: Diagnosis not present

## 2020-03-24 DIAGNOSIS — M25462 Effusion, left knee: Secondary | ICD-10-CM | POA: Diagnosis not present

## 2020-03-25 DIAGNOSIS — F419 Anxiety disorder, unspecified: Secondary | ICD-10-CM | POA: Diagnosis not present

## 2020-03-25 DIAGNOSIS — N179 Acute kidney failure, unspecified: Secondary | ICD-10-CM | POA: Diagnosis not present

## 2020-03-25 DIAGNOSIS — M25461 Effusion, right knee: Secondary | ICD-10-CM | POA: Diagnosis not present

## 2020-03-25 DIAGNOSIS — M25462 Effusion, left knee: Secondary | ICD-10-CM | POA: Diagnosis not present

## 2020-03-25 DIAGNOSIS — I10 Essential (primary) hypertension: Secondary | ICD-10-CM | POA: Diagnosis not present

## 2020-03-25 DIAGNOSIS — N390 Urinary tract infection, site not specified: Secondary | ICD-10-CM | POA: Diagnosis not present

## 2020-03-25 DIAGNOSIS — F0390 Unspecified dementia without behavioral disturbance: Secondary | ICD-10-CM | POA: Diagnosis not present

## 2020-03-25 DIAGNOSIS — M170 Bilateral primary osteoarthritis of knee: Secondary | ICD-10-CM | POA: Diagnosis not present

## 2020-03-28 DIAGNOSIS — I10 Essential (primary) hypertension: Secondary | ICD-10-CM | POA: Diagnosis not present

## 2020-03-28 DIAGNOSIS — F0390 Unspecified dementia without behavioral disturbance: Secondary | ICD-10-CM | POA: Diagnosis not present

## 2020-03-28 DIAGNOSIS — M25461 Effusion, right knee: Secondary | ICD-10-CM | POA: Diagnosis not present

## 2020-03-28 DIAGNOSIS — F419 Anxiety disorder, unspecified: Secondary | ICD-10-CM | POA: Diagnosis not present

## 2020-03-28 DIAGNOSIS — M25462 Effusion, left knee: Secondary | ICD-10-CM | POA: Diagnosis not present

## 2020-03-28 DIAGNOSIS — M170 Bilateral primary osteoarthritis of knee: Secondary | ICD-10-CM | POA: Diagnosis not present

## 2020-03-29 DIAGNOSIS — F0390 Unspecified dementia without behavioral disturbance: Secondary | ICD-10-CM | POA: Diagnosis not present

## 2020-03-29 DIAGNOSIS — M170 Bilateral primary osteoarthritis of knee: Secondary | ICD-10-CM | POA: Diagnosis not present

## 2020-03-29 DIAGNOSIS — M25461 Effusion, right knee: Secondary | ICD-10-CM | POA: Diagnosis not present

## 2020-03-29 DIAGNOSIS — I10 Essential (primary) hypertension: Secondary | ICD-10-CM | POA: Diagnosis not present

## 2020-03-29 DIAGNOSIS — M25462 Effusion, left knee: Secondary | ICD-10-CM | POA: Diagnosis not present

## 2020-03-29 DIAGNOSIS — F419 Anxiety disorder, unspecified: Secondary | ICD-10-CM | POA: Diagnosis not present

## 2020-03-31 DIAGNOSIS — F0390 Unspecified dementia without behavioral disturbance: Secondary | ICD-10-CM | POA: Diagnosis not present

## 2020-03-31 DIAGNOSIS — M25462 Effusion, left knee: Secondary | ICD-10-CM | POA: Diagnosis not present

## 2020-03-31 DIAGNOSIS — F419 Anxiety disorder, unspecified: Secondary | ICD-10-CM | POA: Diagnosis not present

## 2020-03-31 DIAGNOSIS — I10 Essential (primary) hypertension: Secondary | ICD-10-CM | POA: Diagnosis not present

## 2020-03-31 DIAGNOSIS — M25461 Effusion, right knee: Secondary | ICD-10-CM | POA: Diagnosis not present

## 2020-03-31 DIAGNOSIS — M170 Bilateral primary osteoarthritis of knee: Secondary | ICD-10-CM | POA: Diagnosis not present

## 2020-04-04 DIAGNOSIS — M25461 Effusion, right knee: Secondary | ICD-10-CM | POA: Diagnosis not present

## 2020-04-04 DIAGNOSIS — F0390 Unspecified dementia without behavioral disturbance: Secondary | ICD-10-CM | POA: Diagnosis not present

## 2020-04-04 DIAGNOSIS — F419 Anxiety disorder, unspecified: Secondary | ICD-10-CM | POA: Diagnosis not present

## 2020-04-04 DIAGNOSIS — M25462 Effusion, left knee: Secondary | ICD-10-CM | POA: Diagnosis not present

## 2020-04-04 DIAGNOSIS — I10 Essential (primary) hypertension: Secondary | ICD-10-CM | POA: Diagnosis not present

## 2020-04-04 DIAGNOSIS — M170 Bilateral primary osteoarthritis of knee: Secondary | ICD-10-CM | POA: Diagnosis not present

## 2020-04-05 DIAGNOSIS — M25461 Effusion, right knee: Secondary | ICD-10-CM | POA: Diagnosis not present

## 2020-04-05 DIAGNOSIS — M25462 Effusion, left knee: Secondary | ICD-10-CM | POA: Diagnosis not present

## 2020-04-05 DIAGNOSIS — M170 Bilateral primary osteoarthritis of knee: Secondary | ICD-10-CM | POA: Diagnosis not present

## 2020-04-05 DIAGNOSIS — I10 Essential (primary) hypertension: Secondary | ICD-10-CM | POA: Diagnosis not present

## 2020-04-05 DIAGNOSIS — F0390 Unspecified dementia without behavioral disturbance: Secondary | ICD-10-CM | POA: Diagnosis not present

## 2020-04-05 DIAGNOSIS — F419 Anxiety disorder, unspecified: Secondary | ICD-10-CM | POA: Diagnosis not present

## 2020-04-06 DIAGNOSIS — Z741 Need for assistance with personal care: Secondary | ICD-10-CM | POA: Diagnosis not present

## 2020-04-06 DIAGNOSIS — R159 Full incontinence of feces: Secondary | ICD-10-CM | POA: Diagnosis not present

## 2020-04-06 DIAGNOSIS — R197 Diarrhea, unspecified: Secondary | ICD-10-CM | POA: Diagnosis not present

## 2020-04-06 DIAGNOSIS — K219 Gastro-esophageal reflux disease without esophagitis: Secondary | ICD-10-CM | POA: Diagnosis not present

## 2020-04-06 DIAGNOSIS — H04123 Dry eye syndrome of bilateral lacrimal glands: Secondary | ICD-10-CM | POA: Diagnosis not present

## 2020-04-06 DIAGNOSIS — L89212 Pressure ulcer of right hip, stage 2: Secondary | ICD-10-CM | POA: Diagnosis not present

## 2020-04-06 DIAGNOSIS — R32 Unspecified urinary incontinence: Secondary | ICD-10-CM | POA: Diagnosis not present

## 2020-04-06 DIAGNOSIS — L89610 Pressure ulcer of right heel, unstageable: Secondary | ICD-10-CM | POA: Diagnosis not present

## 2020-04-06 DIAGNOSIS — E538 Deficiency of other specified B group vitamins: Secondary | ICD-10-CM | POA: Diagnosis not present

## 2020-04-06 DIAGNOSIS — E559 Vitamin D deficiency, unspecified: Secondary | ICD-10-CM | POA: Diagnosis not present

## 2020-04-06 DIAGNOSIS — I10 Essential (primary) hypertension: Secondary | ICD-10-CM | POA: Diagnosis not present

## 2020-04-06 DIAGNOSIS — Z87891 Personal history of nicotine dependence: Secondary | ICD-10-CM | POA: Diagnosis not present

## 2020-04-06 DIAGNOSIS — G309 Alzheimer's disease, unspecified: Secondary | ICD-10-CM | POA: Diagnosis not present

## 2020-04-06 DIAGNOSIS — L89620 Pressure ulcer of left heel, unstageable: Secondary | ICD-10-CM | POA: Diagnosis not present

## 2020-04-06 DIAGNOSIS — M1990 Unspecified osteoarthritis, unspecified site: Secondary | ICD-10-CM | POA: Diagnosis not present

## 2020-04-06 DIAGNOSIS — J302 Other seasonal allergic rhinitis: Secondary | ICD-10-CM | POA: Diagnosis not present

## 2020-04-06 DIAGNOSIS — F0281 Dementia in other diseases classified elsewhere with behavioral disturbance: Secondary | ICD-10-CM | POA: Diagnosis not present

## 2020-04-07 DIAGNOSIS — L89620 Pressure ulcer of left heel, unstageable: Secondary | ICD-10-CM | POA: Diagnosis not present

## 2020-04-07 DIAGNOSIS — F0281 Dementia in other diseases classified elsewhere with behavioral disturbance: Secondary | ICD-10-CM | POA: Diagnosis not present

## 2020-04-07 DIAGNOSIS — L89212 Pressure ulcer of right hip, stage 2: Secondary | ICD-10-CM | POA: Diagnosis not present

## 2020-04-07 DIAGNOSIS — G309 Alzheimer's disease, unspecified: Secondary | ICD-10-CM | POA: Diagnosis not present

## 2020-04-07 DIAGNOSIS — I10 Essential (primary) hypertension: Secondary | ICD-10-CM | POA: Diagnosis not present

## 2020-04-07 DIAGNOSIS — L89610 Pressure ulcer of right heel, unstageable: Secondary | ICD-10-CM | POA: Diagnosis not present

## 2020-04-09 DIAGNOSIS — L89620 Pressure ulcer of left heel, unstageable: Secondary | ICD-10-CM | POA: Diagnosis not present

## 2020-04-09 DIAGNOSIS — L89212 Pressure ulcer of right hip, stage 2: Secondary | ICD-10-CM | POA: Diagnosis not present

## 2020-04-09 DIAGNOSIS — L89610 Pressure ulcer of right heel, unstageable: Secondary | ICD-10-CM | POA: Diagnosis not present

## 2020-04-09 DIAGNOSIS — F0281 Dementia in other diseases classified elsewhere with behavioral disturbance: Secondary | ICD-10-CM | POA: Diagnosis not present

## 2020-04-09 DIAGNOSIS — G309 Alzheimer's disease, unspecified: Secondary | ICD-10-CM | POA: Diagnosis not present

## 2020-04-09 DIAGNOSIS — I10 Essential (primary) hypertension: Secondary | ICD-10-CM | POA: Diagnosis not present

## 2020-04-11 DIAGNOSIS — I10 Essential (primary) hypertension: Secondary | ICD-10-CM | POA: Diagnosis not present

## 2020-04-11 DIAGNOSIS — L89212 Pressure ulcer of right hip, stage 2: Secondary | ICD-10-CM | POA: Diagnosis not present

## 2020-04-11 DIAGNOSIS — G309 Alzheimer's disease, unspecified: Secondary | ICD-10-CM | POA: Diagnosis not present

## 2020-04-11 DIAGNOSIS — F0281 Dementia in other diseases classified elsewhere with behavioral disturbance: Secondary | ICD-10-CM | POA: Diagnosis not present

## 2020-04-11 DIAGNOSIS — L89620 Pressure ulcer of left heel, unstageable: Secondary | ICD-10-CM | POA: Diagnosis not present

## 2020-04-11 DIAGNOSIS — L89610 Pressure ulcer of right heel, unstageable: Secondary | ICD-10-CM | POA: Diagnosis not present

## 2020-04-18 DIAGNOSIS — F0281 Dementia in other diseases classified elsewhere with behavioral disturbance: Secondary | ICD-10-CM | POA: Diagnosis not present

## 2020-04-18 DIAGNOSIS — L89620 Pressure ulcer of left heel, unstageable: Secondary | ICD-10-CM | POA: Diagnosis not present

## 2020-04-18 DIAGNOSIS — L89610 Pressure ulcer of right heel, unstageable: Secondary | ICD-10-CM | POA: Diagnosis not present

## 2020-04-18 DIAGNOSIS — L89212 Pressure ulcer of right hip, stage 2: Secondary | ICD-10-CM | POA: Diagnosis not present

## 2020-04-18 DIAGNOSIS — G309 Alzheimer's disease, unspecified: Secondary | ICD-10-CM | POA: Diagnosis not present

## 2020-04-18 DIAGNOSIS — I10 Essential (primary) hypertension: Secondary | ICD-10-CM | POA: Diagnosis not present

## 2020-04-26 DIAGNOSIS — I10 Essential (primary) hypertension: Secondary | ICD-10-CM | POA: Diagnosis not present

## 2020-04-26 DIAGNOSIS — L89620 Pressure ulcer of left heel, unstageable: Secondary | ICD-10-CM | POA: Diagnosis not present

## 2020-04-26 DIAGNOSIS — F0281 Dementia in other diseases classified elsewhere with behavioral disturbance: Secondary | ICD-10-CM | POA: Diagnosis not present

## 2020-04-26 DIAGNOSIS — G309 Alzheimer's disease, unspecified: Secondary | ICD-10-CM | POA: Diagnosis not present

## 2020-04-26 DIAGNOSIS — L89610 Pressure ulcer of right heel, unstageable: Secondary | ICD-10-CM | POA: Diagnosis not present

## 2020-04-26 DIAGNOSIS — L89212 Pressure ulcer of right hip, stage 2: Secondary | ICD-10-CM | POA: Diagnosis not present

## 2020-04-27 DIAGNOSIS — L89212 Pressure ulcer of right hip, stage 2: Secondary | ICD-10-CM | POA: Diagnosis not present

## 2020-04-27 DIAGNOSIS — R159 Full incontinence of feces: Secondary | ICD-10-CM | POA: Diagnosis not present

## 2020-04-27 DIAGNOSIS — F0281 Dementia in other diseases classified elsewhere with behavioral disturbance: Secondary | ICD-10-CM | POA: Diagnosis not present

## 2020-04-27 DIAGNOSIS — R197 Diarrhea, unspecified: Secondary | ICD-10-CM | POA: Diagnosis not present

## 2020-04-27 DIAGNOSIS — M1990 Unspecified osteoarthritis, unspecified site: Secondary | ICD-10-CM | POA: Diagnosis not present

## 2020-04-27 DIAGNOSIS — E538 Deficiency of other specified B group vitamins: Secondary | ICD-10-CM | POA: Diagnosis not present

## 2020-04-27 DIAGNOSIS — L89620 Pressure ulcer of left heel, unstageable: Secondary | ICD-10-CM | POA: Diagnosis not present

## 2020-04-27 DIAGNOSIS — I10 Essential (primary) hypertension: Secondary | ICD-10-CM | POA: Diagnosis not present

## 2020-04-27 DIAGNOSIS — E559 Vitamin D deficiency, unspecified: Secondary | ICD-10-CM | POA: Diagnosis not present

## 2020-04-27 DIAGNOSIS — K219 Gastro-esophageal reflux disease without esophagitis: Secondary | ICD-10-CM | POA: Diagnosis not present

## 2020-04-27 DIAGNOSIS — R32 Unspecified urinary incontinence: Secondary | ICD-10-CM | POA: Diagnosis not present

## 2020-04-27 DIAGNOSIS — J302 Other seasonal allergic rhinitis: Secondary | ICD-10-CM | POA: Diagnosis not present

## 2020-04-27 DIAGNOSIS — H04123 Dry eye syndrome of bilateral lacrimal glands: Secondary | ICD-10-CM | POA: Diagnosis not present

## 2020-04-27 DIAGNOSIS — Z741 Need for assistance with personal care: Secondary | ICD-10-CM | POA: Diagnosis not present

## 2020-04-27 DIAGNOSIS — G309 Alzheimer's disease, unspecified: Secondary | ICD-10-CM | POA: Diagnosis not present

## 2020-04-27 DIAGNOSIS — Z87891 Personal history of nicotine dependence: Secondary | ICD-10-CM | POA: Diagnosis not present

## 2020-04-27 DIAGNOSIS — L89610 Pressure ulcer of right heel, unstageable: Secondary | ICD-10-CM | POA: Diagnosis not present

## 2020-05-03 DIAGNOSIS — G309 Alzheimer's disease, unspecified: Secondary | ICD-10-CM | POA: Diagnosis not present

## 2020-05-03 DIAGNOSIS — L89212 Pressure ulcer of right hip, stage 2: Secondary | ICD-10-CM | POA: Diagnosis not present

## 2020-05-03 DIAGNOSIS — I10 Essential (primary) hypertension: Secondary | ICD-10-CM | POA: Diagnosis not present

## 2020-05-03 DIAGNOSIS — F0281 Dementia in other diseases classified elsewhere with behavioral disturbance: Secondary | ICD-10-CM | POA: Diagnosis not present

## 2020-05-03 DIAGNOSIS — L89610 Pressure ulcer of right heel, unstageable: Secondary | ICD-10-CM | POA: Diagnosis not present

## 2020-05-03 DIAGNOSIS — L89620 Pressure ulcer of left heel, unstageable: Secondary | ICD-10-CM | POA: Diagnosis not present

## 2020-05-10 DIAGNOSIS — L89620 Pressure ulcer of left heel, unstageable: Secondary | ICD-10-CM | POA: Diagnosis not present

## 2020-05-10 DIAGNOSIS — G309 Alzheimer's disease, unspecified: Secondary | ICD-10-CM | POA: Diagnosis not present

## 2020-05-10 DIAGNOSIS — F0281 Dementia in other diseases classified elsewhere with behavioral disturbance: Secondary | ICD-10-CM | POA: Diagnosis not present

## 2020-05-10 DIAGNOSIS — L89610 Pressure ulcer of right heel, unstageable: Secondary | ICD-10-CM | POA: Diagnosis not present

## 2020-05-10 DIAGNOSIS — I10 Essential (primary) hypertension: Secondary | ICD-10-CM | POA: Diagnosis not present

## 2020-05-10 DIAGNOSIS — L89212 Pressure ulcer of right hip, stage 2: Secondary | ICD-10-CM | POA: Diagnosis not present

## 2020-05-17 DIAGNOSIS — L89610 Pressure ulcer of right heel, unstageable: Secondary | ICD-10-CM | POA: Diagnosis not present

## 2020-05-17 DIAGNOSIS — F0281 Dementia in other diseases classified elsewhere with behavioral disturbance: Secondary | ICD-10-CM | POA: Diagnosis not present

## 2020-05-17 DIAGNOSIS — I10 Essential (primary) hypertension: Secondary | ICD-10-CM | POA: Diagnosis not present

## 2020-05-17 DIAGNOSIS — L89212 Pressure ulcer of right hip, stage 2: Secondary | ICD-10-CM | POA: Diagnosis not present

## 2020-05-17 DIAGNOSIS — L89620 Pressure ulcer of left heel, unstageable: Secondary | ICD-10-CM | POA: Diagnosis not present

## 2020-05-17 DIAGNOSIS — G309 Alzheimer's disease, unspecified: Secondary | ICD-10-CM | POA: Diagnosis not present

## 2020-05-18 DIAGNOSIS — I10 Essential (primary) hypertension: Secondary | ICD-10-CM | POA: Diagnosis not present

## 2020-05-18 DIAGNOSIS — L89610 Pressure ulcer of right heel, unstageable: Secondary | ICD-10-CM | POA: Diagnosis not present

## 2020-05-18 DIAGNOSIS — L89620 Pressure ulcer of left heel, unstageable: Secondary | ICD-10-CM | POA: Diagnosis not present

## 2020-05-18 DIAGNOSIS — G309 Alzheimer's disease, unspecified: Secondary | ICD-10-CM | POA: Diagnosis not present

## 2020-05-18 DIAGNOSIS — L89212 Pressure ulcer of right hip, stage 2: Secondary | ICD-10-CM | POA: Diagnosis not present

## 2020-05-18 DIAGNOSIS — F0281 Dementia in other diseases classified elsewhere with behavioral disturbance: Secondary | ICD-10-CM | POA: Diagnosis not present

## 2020-05-25 DIAGNOSIS — L89212 Pressure ulcer of right hip, stage 2: Secondary | ICD-10-CM | POA: Diagnosis not present

## 2020-05-25 DIAGNOSIS — L89610 Pressure ulcer of right heel, unstageable: Secondary | ICD-10-CM | POA: Diagnosis not present

## 2020-05-25 DIAGNOSIS — L89620 Pressure ulcer of left heel, unstageable: Secondary | ICD-10-CM | POA: Diagnosis not present

## 2020-05-25 DIAGNOSIS — G309 Alzheimer's disease, unspecified: Secondary | ICD-10-CM | POA: Diagnosis not present

## 2020-05-25 DIAGNOSIS — I10 Essential (primary) hypertension: Secondary | ICD-10-CM | POA: Diagnosis not present

## 2020-05-25 DIAGNOSIS — F0281 Dementia in other diseases classified elsewhere with behavioral disturbance: Secondary | ICD-10-CM | POA: Diagnosis not present

## 2020-05-26 DIAGNOSIS — L89610 Pressure ulcer of right heel, unstageable: Secondary | ICD-10-CM | POA: Diagnosis not present

## 2020-05-26 DIAGNOSIS — G309 Alzheimer's disease, unspecified: Secondary | ICD-10-CM | POA: Diagnosis not present

## 2020-05-26 DIAGNOSIS — F0281 Dementia in other diseases classified elsewhere with behavioral disturbance: Secondary | ICD-10-CM | POA: Diagnosis not present

## 2020-05-26 DIAGNOSIS — L89212 Pressure ulcer of right hip, stage 2: Secondary | ICD-10-CM | POA: Diagnosis not present

## 2020-05-26 DIAGNOSIS — L89620 Pressure ulcer of left heel, unstageable: Secondary | ICD-10-CM | POA: Diagnosis not present

## 2020-05-26 DIAGNOSIS — I10 Essential (primary) hypertension: Secondary | ICD-10-CM | POA: Diagnosis not present

## 2020-05-28 DIAGNOSIS — Z87891 Personal history of nicotine dependence: Secondary | ICD-10-CM | POA: Diagnosis not present

## 2020-05-28 DIAGNOSIS — L89610 Pressure ulcer of right heel, unstageable: Secondary | ICD-10-CM | POA: Diagnosis not present

## 2020-05-28 DIAGNOSIS — Z741 Need for assistance with personal care: Secondary | ICD-10-CM | POA: Diagnosis not present

## 2020-05-28 DIAGNOSIS — J302 Other seasonal allergic rhinitis: Secondary | ICD-10-CM | POA: Diagnosis not present

## 2020-05-28 DIAGNOSIS — H04123 Dry eye syndrome of bilateral lacrimal glands: Secondary | ICD-10-CM | POA: Diagnosis not present

## 2020-05-28 DIAGNOSIS — E538 Deficiency of other specified B group vitamins: Secondary | ICD-10-CM | POA: Diagnosis not present

## 2020-05-28 DIAGNOSIS — R32 Unspecified urinary incontinence: Secondary | ICD-10-CM | POA: Diagnosis not present

## 2020-05-28 DIAGNOSIS — R197 Diarrhea, unspecified: Secondary | ICD-10-CM | POA: Diagnosis not present

## 2020-05-28 DIAGNOSIS — M1990 Unspecified osteoarthritis, unspecified site: Secondary | ICD-10-CM | POA: Diagnosis not present

## 2020-05-28 DIAGNOSIS — K219 Gastro-esophageal reflux disease without esophagitis: Secondary | ICD-10-CM | POA: Diagnosis not present

## 2020-05-28 DIAGNOSIS — R159 Full incontinence of feces: Secondary | ICD-10-CM | POA: Diagnosis not present

## 2020-05-28 DIAGNOSIS — G309 Alzheimer's disease, unspecified: Secondary | ICD-10-CM | POA: Diagnosis not present

## 2020-05-28 DIAGNOSIS — I10 Essential (primary) hypertension: Secondary | ICD-10-CM | POA: Diagnosis not present

## 2020-05-28 DIAGNOSIS — E559 Vitamin D deficiency, unspecified: Secondary | ICD-10-CM | POA: Diagnosis not present

## 2020-05-28 DIAGNOSIS — F0281 Dementia in other diseases classified elsewhere with behavioral disturbance: Secondary | ICD-10-CM | POA: Diagnosis not present

## 2020-05-28 DIAGNOSIS — L89212 Pressure ulcer of right hip, stage 2: Secondary | ICD-10-CM | POA: Diagnosis not present

## 2020-05-28 DIAGNOSIS — L89620 Pressure ulcer of left heel, unstageable: Secondary | ICD-10-CM | POA: Diagnosis not present

## 2020-05-31 DIAGNOSIS — L89212 Pressure ulcer of right hip, stage 2: Secondary | ICD-10-CM | POA: Diagnosis not present

## 2020-05-31 DIAGNOSIS — F0281 Dementia in other diseases classified elsewhere with behavioral disturbance: Secondary | ICD-10-CM | POA: Diagnosis not present

## 2020-05-31 DIAGNOSIS — I10 Essential (primary) hypertension: Secondary | ICD-10-CM | POA: Diagnosis not present

## 2020-05-31 DIAGNOSIS — L89610 Pressure ulcer of right heel, unstageable: Secondary | ICD-10-CM | POA: Diagnosis not present

## 2020-05-31 DIAGNOSIS — G309 Alzheimer's disease, unspecified: Secondary | ICD-10-CM | POA: Diagnosis not present

## 2020-05-31 DIAGNOSIS — L89620 Pressure ulcer of left heel, unstageable: Secondary | ICD-10-CM | POA: Diagnosis not present

## 2020-06-03 DIAGNOSIS — L89620 Pressure ulcer of left heel, unstageable: Secondary | ICD-10-CM | POA: Diagnosis not present

## 2020-06-03 DIAGNOSIS — L89610 Pressure ulcer of right heel, unstageable: Secondary | ICD-10-CM | POA: Diagnosis not present

## 2020-06-03 DIAGNOSIS — F0281 Dementia in other diseases classified elsewhere with behavioral disturbance: Secondary | ICD-10-CM | POA: Diagnosis not present

## 2020-06-03 DIAGNOSIS — G309 Alzheimer's disease, unspecified: Secondary | ICD-10-CM | POA: Diagnosis not present

## 2020-06-03 DIAGNOSIS — L89212 Pressure ulcer of right hip, stage 2: Secondary | ICD-10-CM | POA: Diagnosis not present

## 2020-06-03 DIAGNOSIS — I10 Essential (primary) hypertension: Secondary | ICD-10-CM | POA: Diagnosis not present

## 2020-06-07 DIAGNOSIS — L89610 Pressure ulcer of right heel, unstageable: Secondary | ICD-10-CM | POA: Diagnosis not present

## 2020-06-07 DIAGNOSIS — F0281 Dementia in other diseases classified elsewhere with behavioral disturbance: Secondary | ICD-10-CM | POA: Diagnosis not present

## 2020-06-07 DIAGNOSIS — L89620 Pressure ulcer of left heel, unstageable: Secondary | ICD-10-CM | POA: Diagnosis not present

## 2020-06-07 DIAGNOSIS — I10 Essential (primary) hypertension: Secondary | ICD-10-CM | POA: Diagnosis not present

## 2020-06-07 DIAGNOSIS — L89212 Pressure ulcer of right hip, stage 2: Secondary | ICD-10-CM | POA: Diagnosis not present

## 2020-06-07 DIAGNOSIS — G309 Alzheimer's disease, unspecified: Secondary | ICD-10-CM | POA: Diagnosis not present

## 2020-06-10 DIAGNOSIS — F0281 Dementia in other diseases classified elsewhere with behavioral disturbance: Secondary | ICD-10-CM | POA: Diagnosis not present

## 2020-06-10 DIAGNOSIS — I10 Essential (primary) hypertension: Secondary | ICD-10-CM | POA: Diagnosis not present

## 2020-06-10 DIAGNOSIS — L89610 Pressure ulcer of right heel, unstageable: Secondary | ICD-10-CM | POA: Diagnosis not present

## 2020-06-10 DIAGNOSIS — L89212 Pressure ulcer of right hip, stage 2: Secondary | ICD-10-CM | POA: Diagnosis not present

## 2020-06-10 DIAGNOSIS — G309 Alzheimer's disease, unspecified: Secondary | ICD-10-CM | POA: Diagnosis not present

## 2020-06-10 DIAGNOSIS — L89620 Pressure ulcer of left heel, unstageable: Secondary | ICD-10-CM | POA: Diagnosis not present

## 2020-06-14 DIAGNOSIS — R404 Transient alteration of awareness: Secondary | ICD-10-CM | POA: Diagnosis not present

## 2020-06-28 DIAGNOSIS — 419620001 Death: Secondary | SNOMED CT | POA: Diagnosis not present

## 2020-06-28 DEATH — deceased

## 2021-03-13 IMAGING — DX DG KNEE COMPLETE 4+V*R*
4 series · 4 of 4 positions shown · non-contrast
Comparison: August 31, 2016

CLINICAL DATA: Weakness.  Fall.

EXAM:
RIGHT KNEE - COMPLETE 4+ VIEW

[knee obl (1 of 2)]
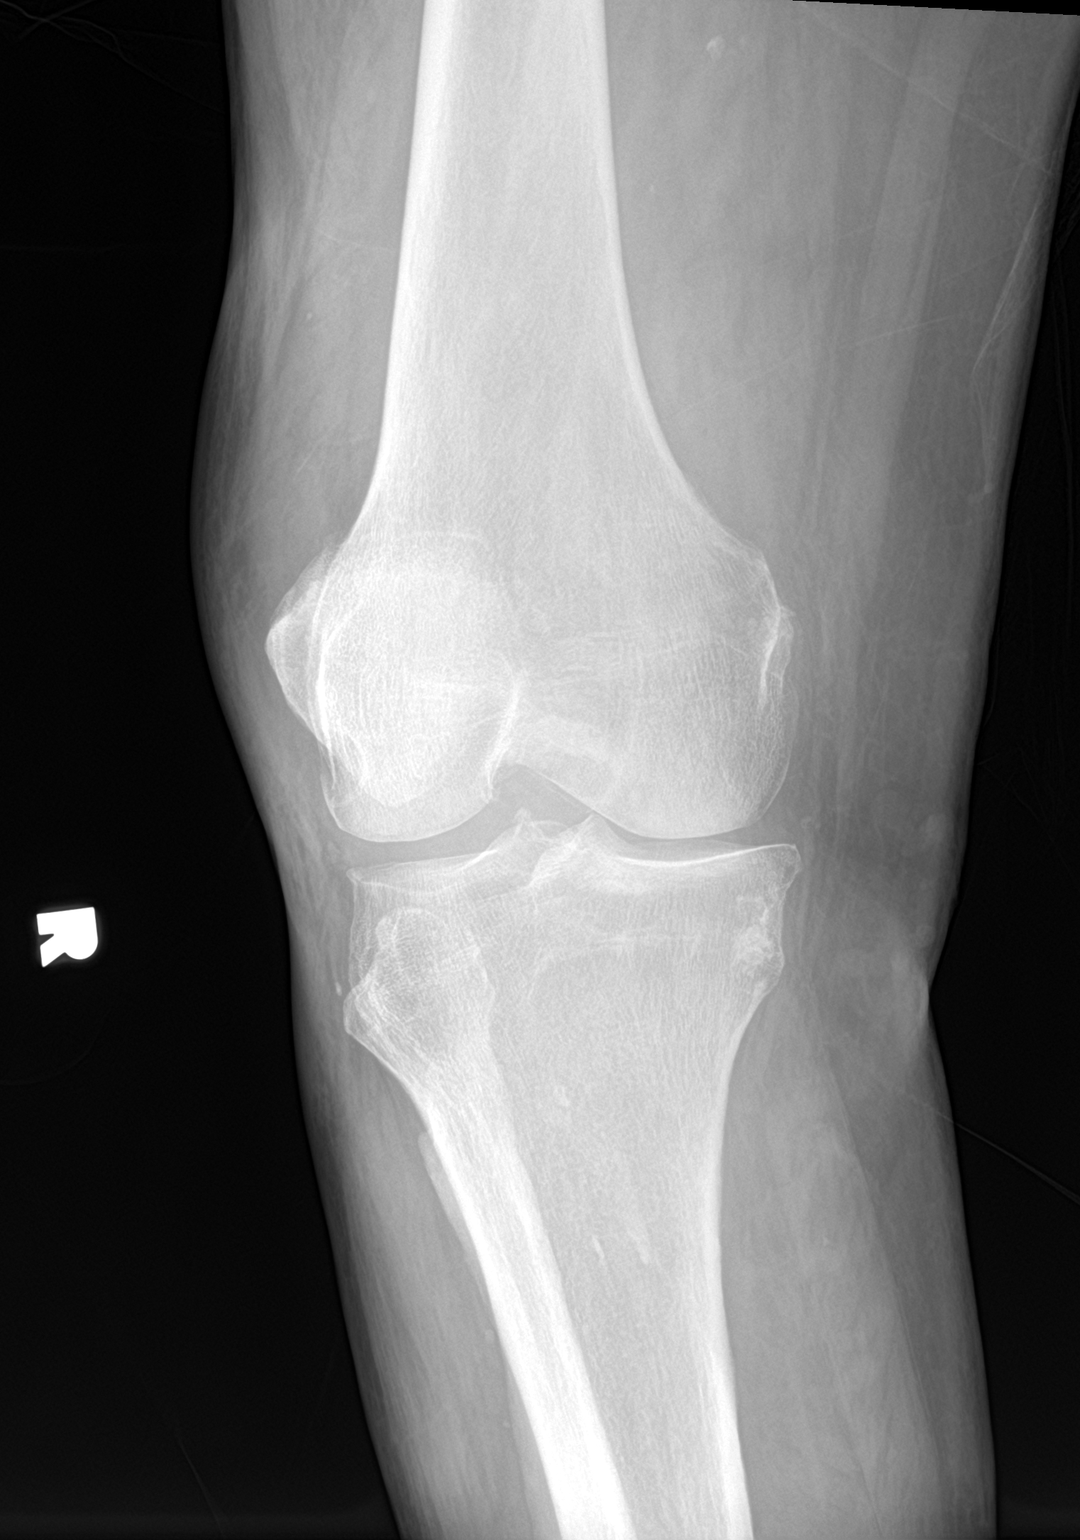

[knee lat]
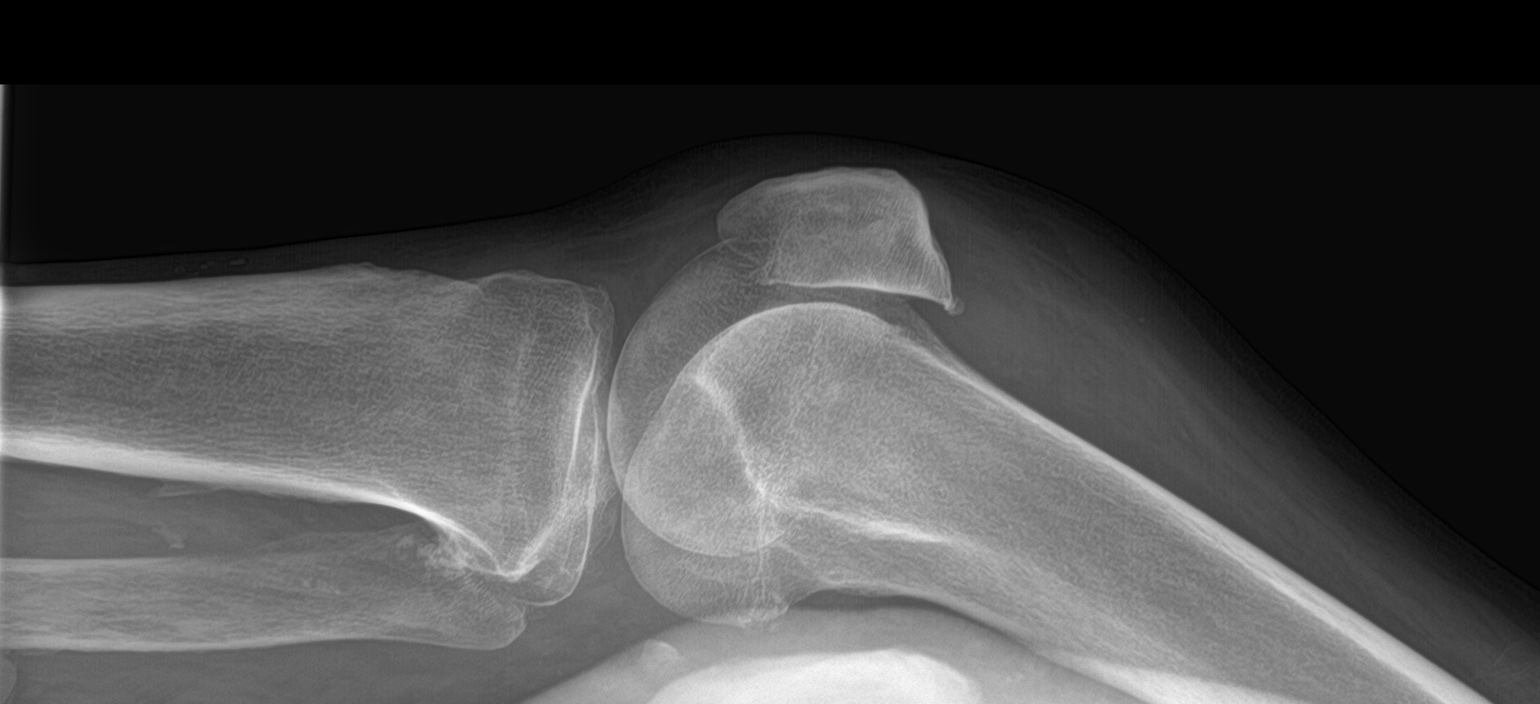

[knee ap]
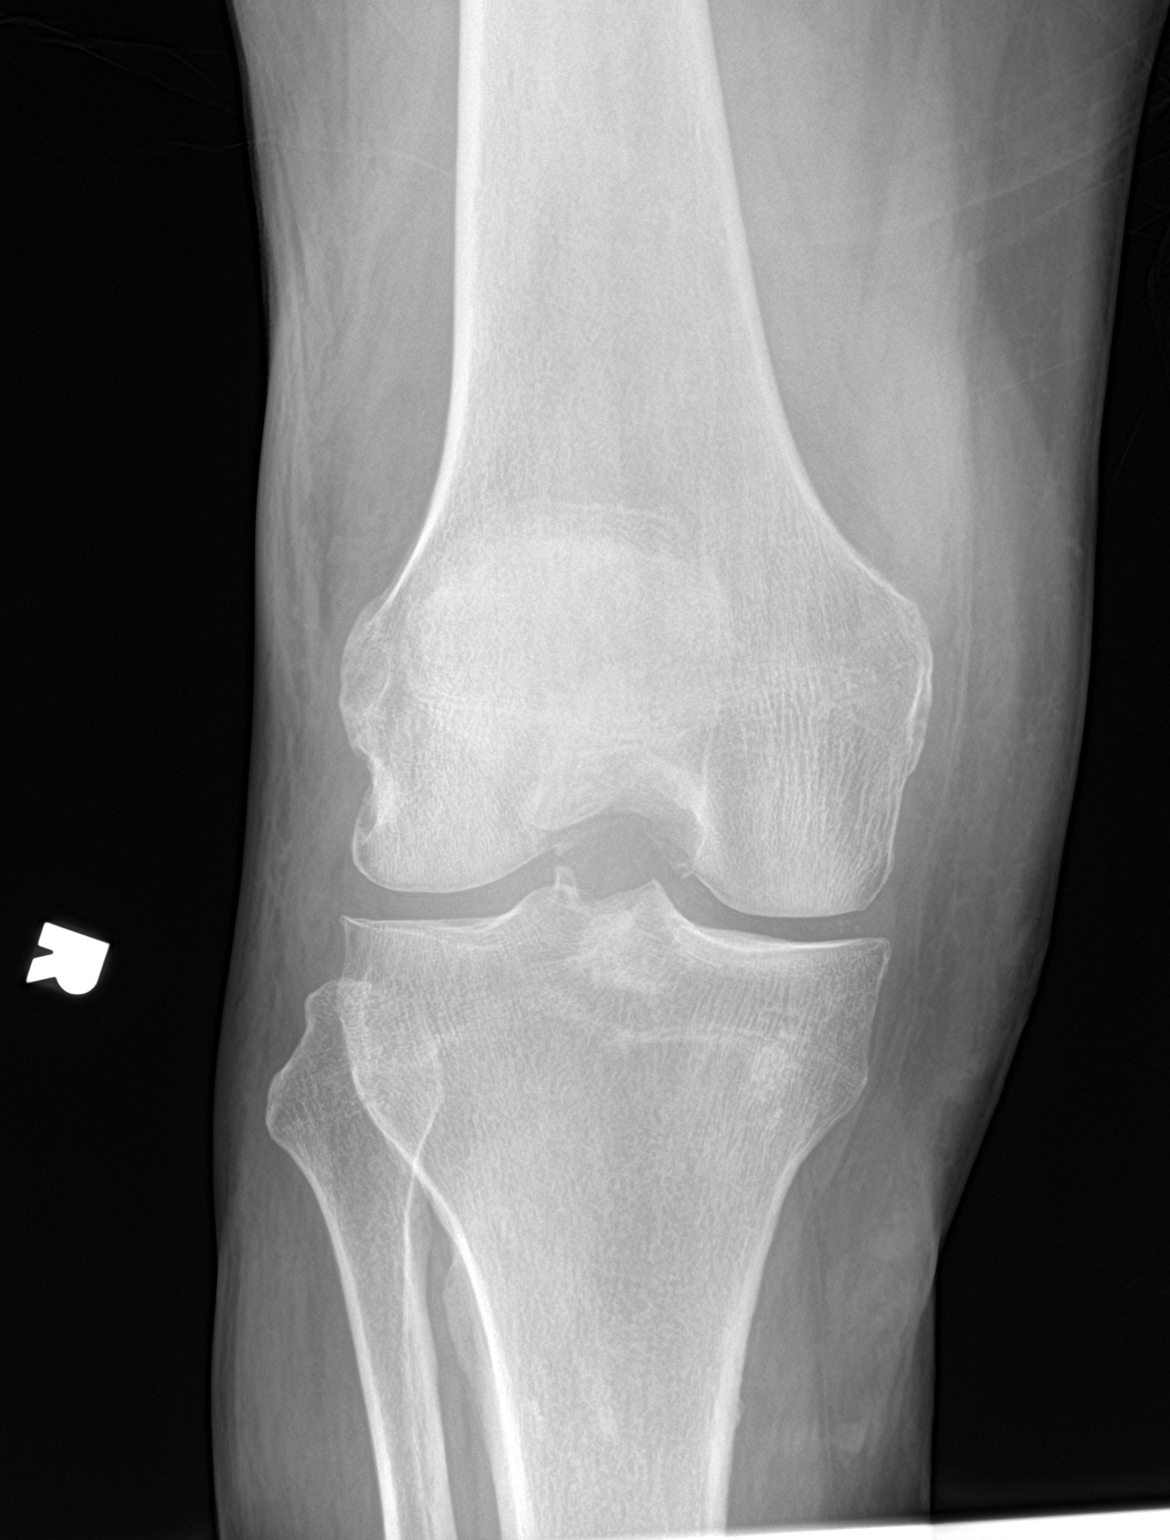

[knee obl (2 of 2)]
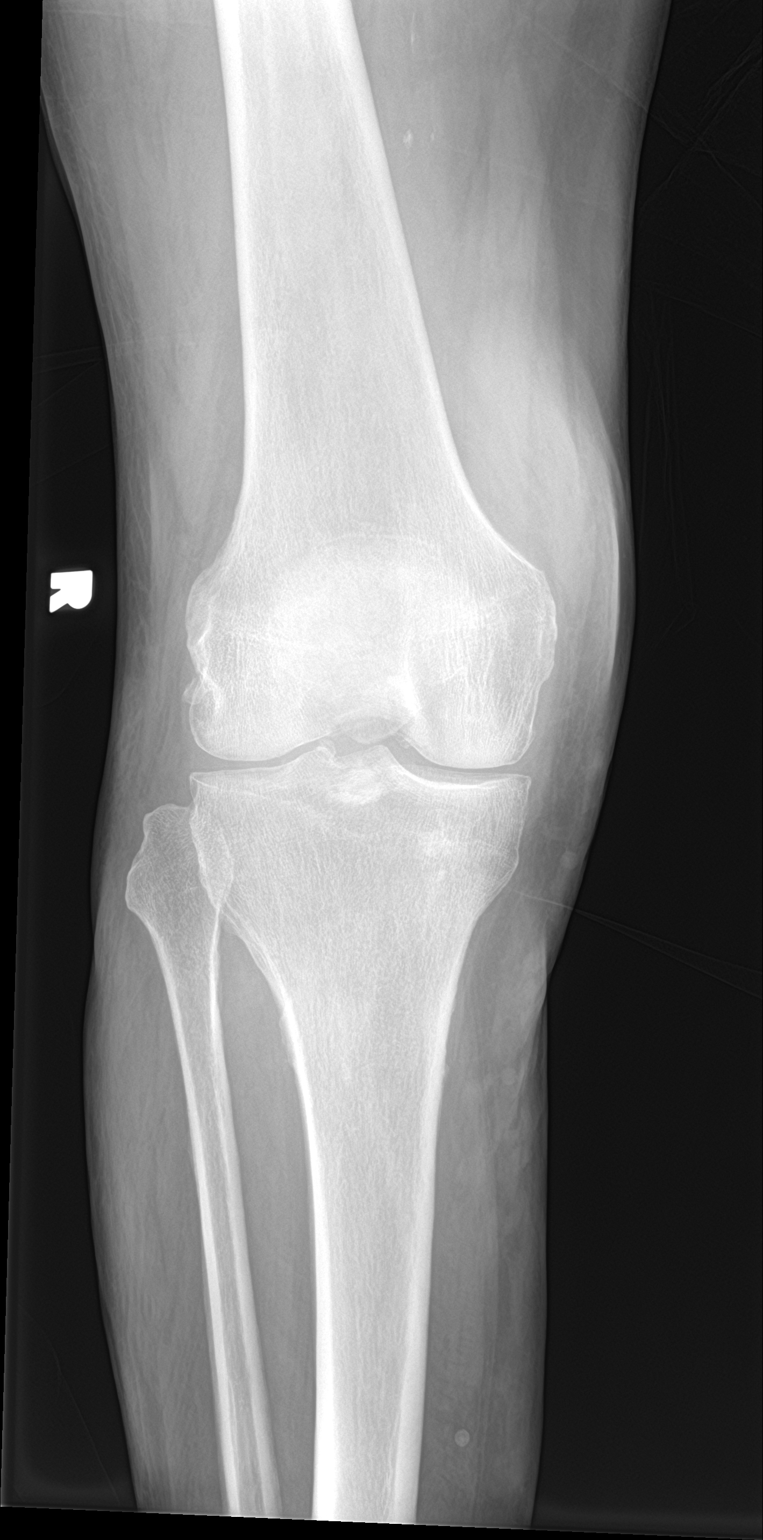

[4 of 4 positions shown; findings below may reference images not displayed]

FINDINGS: No evidence of acute fracture or malalignment. Moderate joint
effusion. Moderate patellofemoral degenerative change. Soft tissue
swelling about the knee.
IMPRESSION: 1. No evidence of acute fracture or malalignment.
2. Moderate joint effusion.

## 2021-03-13 IMAGING — CT CT CERVICAL SPINE W/O CM
3 of 4 series · 12 of 33 positions shown, 14 images · non-contrast
Comparison: None.

CLINICAL DATA: Fall yesterday.  Dimension

EXAM:
CT HEAD WITHOUT CONTRAST
CT CERVICAL SPINE WITHOUT CONTRAST
TECHNIQUE: Multidetector CT imaging of the head and cervical spine was
performed following the standard protocol without intravenous
contrast. Multiplanar CT image reconstructions of the cervical spine
were also generated.

[Series 5: coronals · coronal · 0.31mm/px · 3 of 83 slices shown]
[im 17/83  bone]
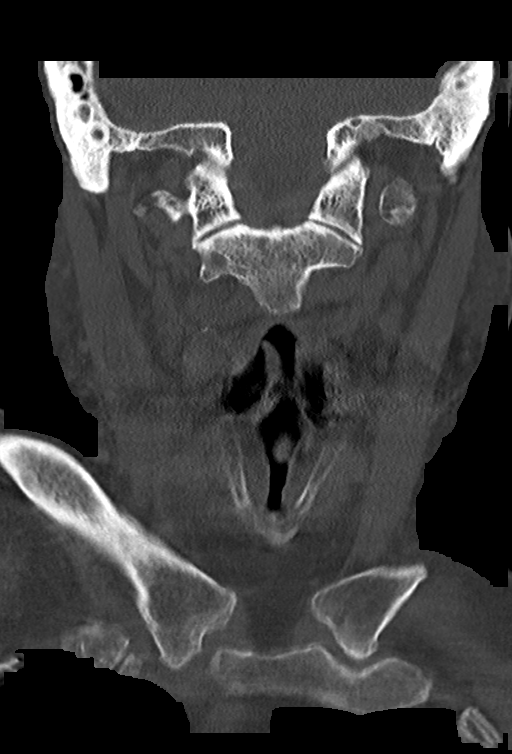
[im 33/83  bone]
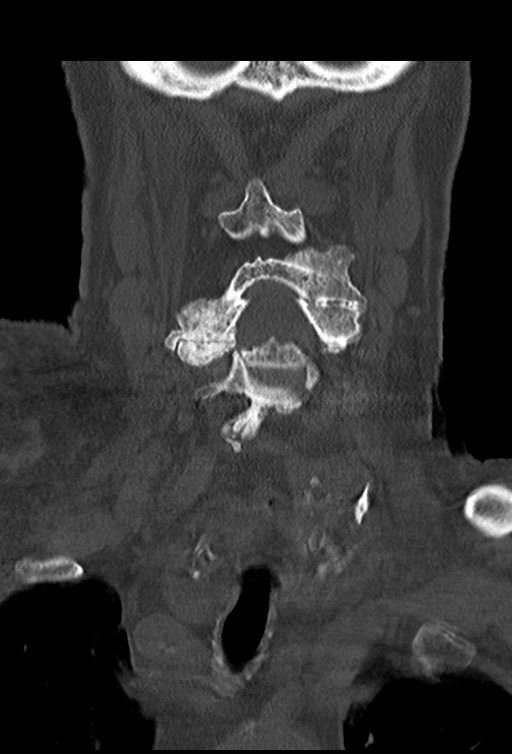
[im 50/83  bone]
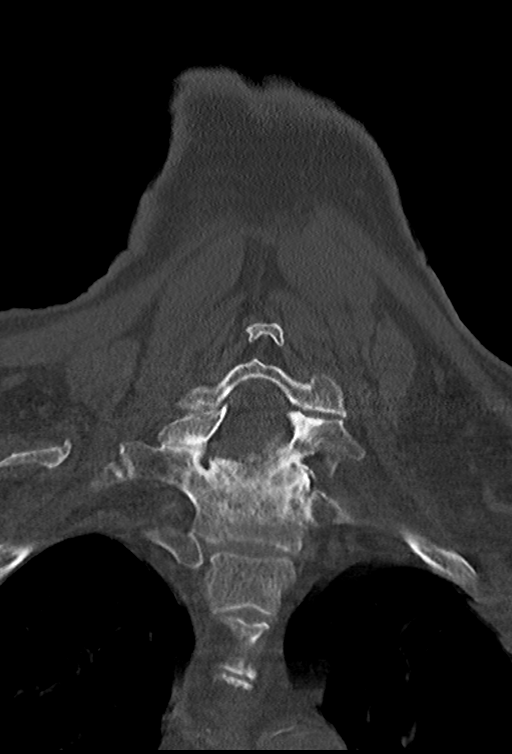

[Series 6: sagittals · sagittal · 0.36mm/px · 5 of 67 slices shown, 6 images]
[im 23/67  bone]
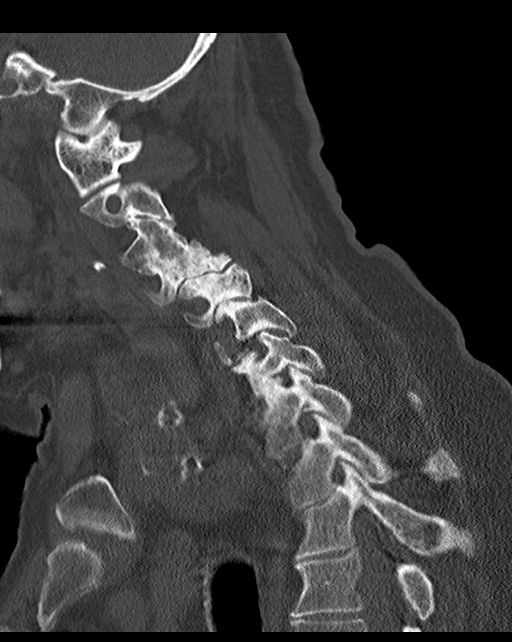
[im 28/67  bone]
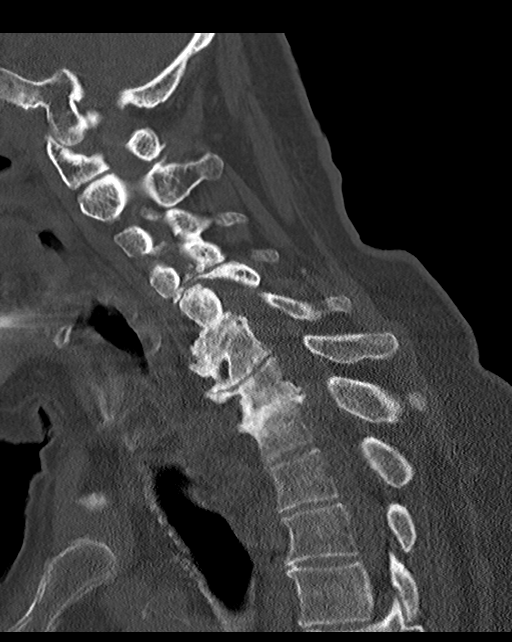
[im 34/67  soft-tissue]
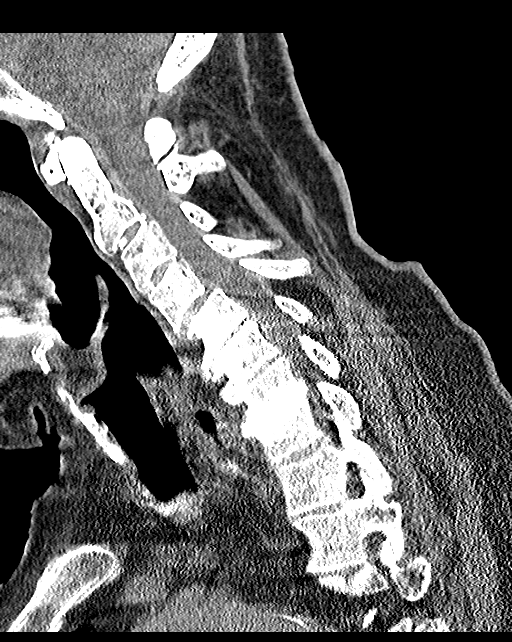
[im 34/67  bone]
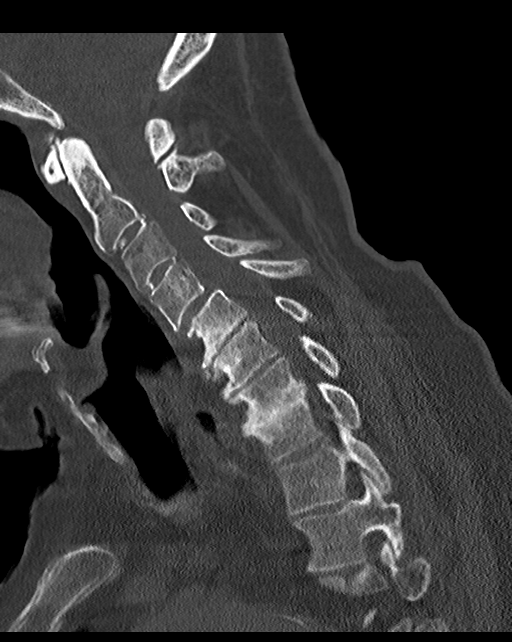
[im 39/67  bone]
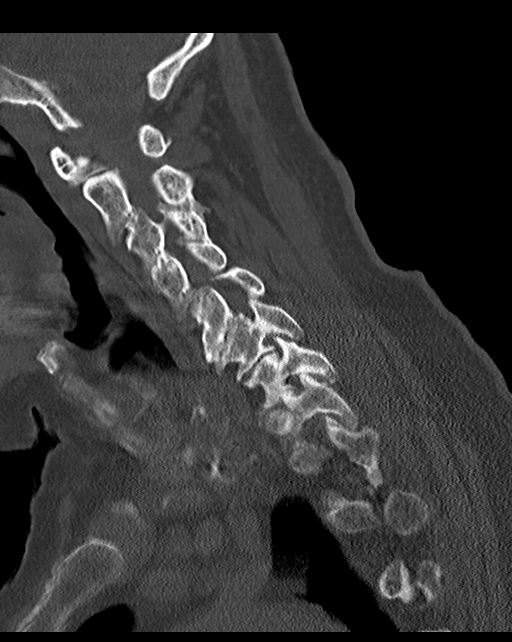
[im 45/67  bone]
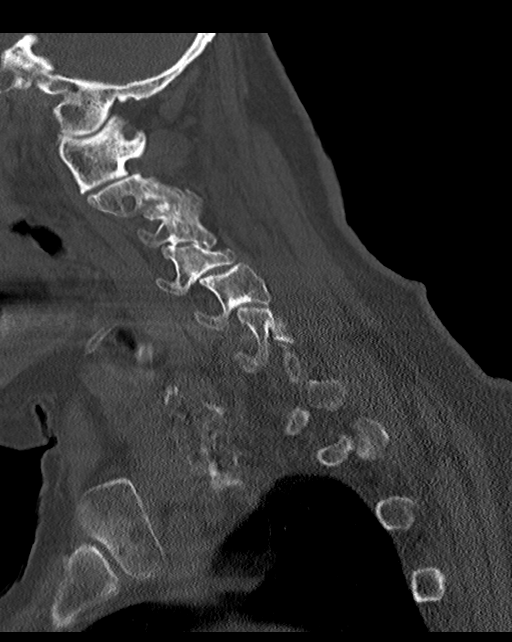

[Series 7: orthogonals · axial · 0.23mm/px · z∈[-299,-152]mm · 4 of 121 slices shown, 5 images]
[im 21/121  soft-tissue]
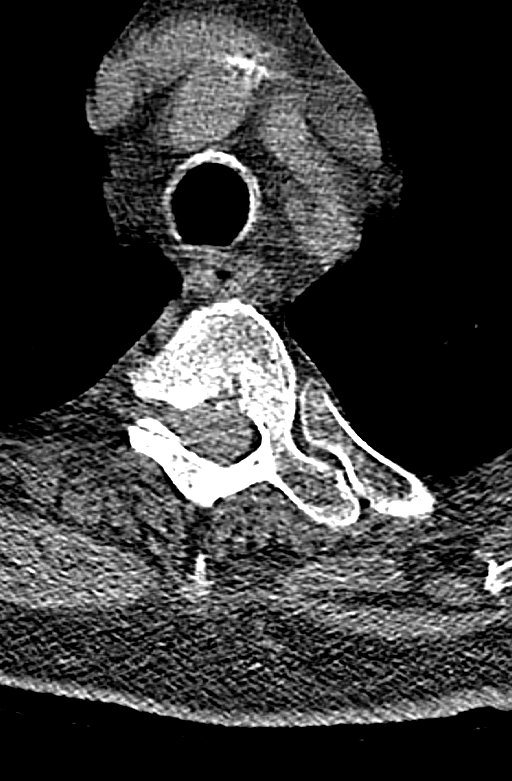
[im 21/121  bone]
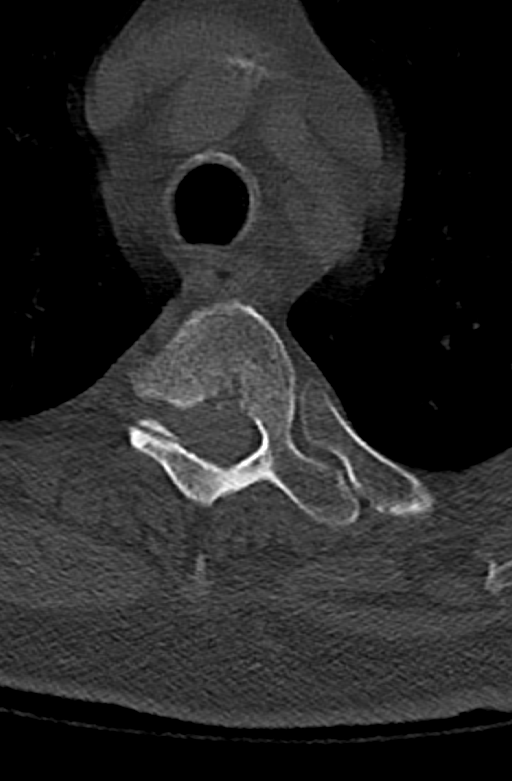
[im 41/121  bone]
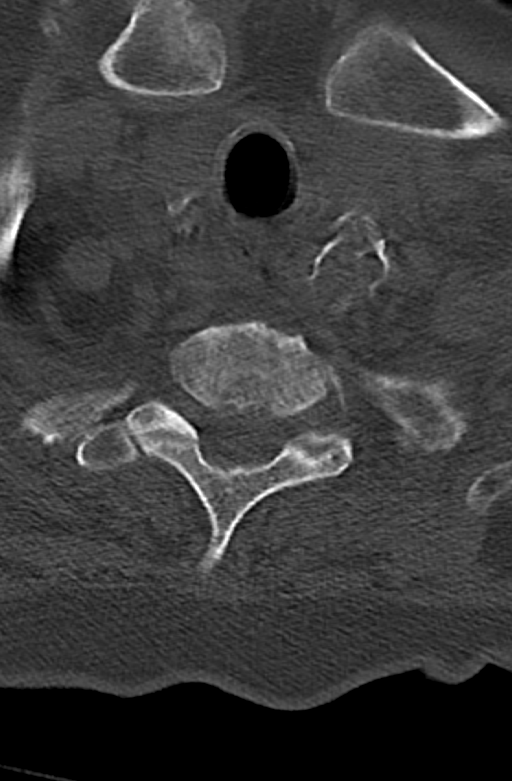
[im 81/121  bone]
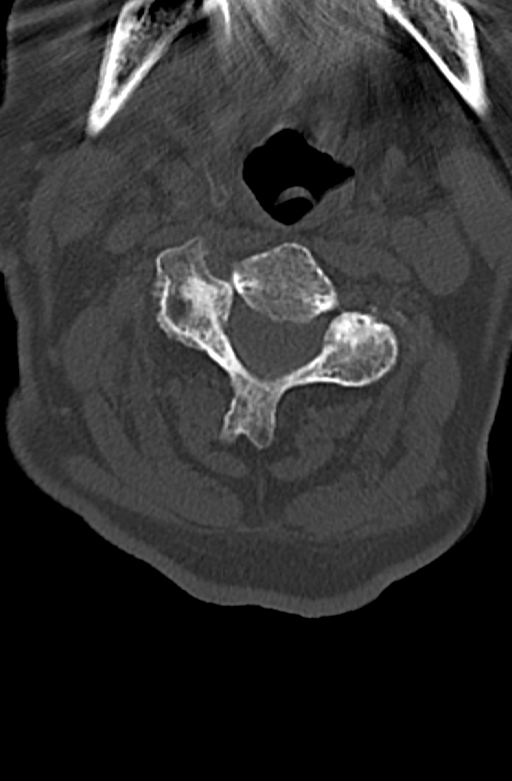
[im 101/121  bone]
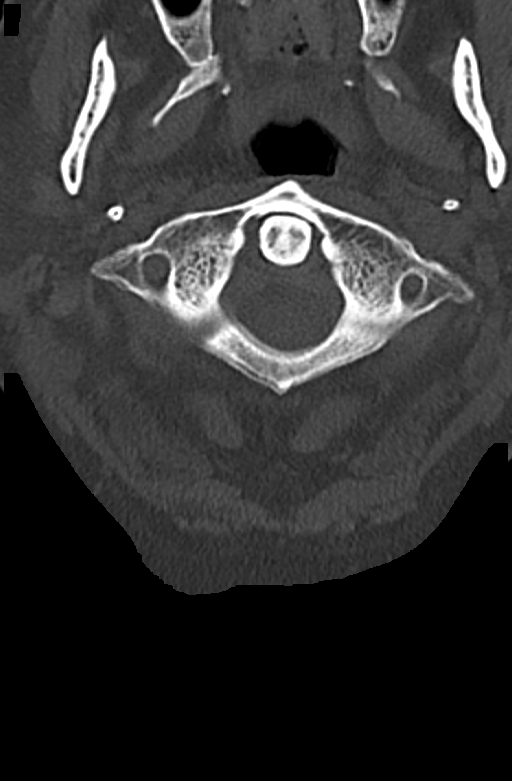

[12 of 33 positions shown; findings below may reference images not displayed]

FINDINGS: CT HEAD FINDINGS

Brain: Extensive cortical atrophy. Patchy white matter hypodensity
bilaterally. Benign-appearing calcification in the right frontal
lobe.

Negative for acute infarct, hemorrhage, mass.

Image quality degraded by extensive motion.  Repeat images obtained.

Vascular: Negative for hyperdense vessel

Skull: Negative

Sinuses/Orbits: Negative

Other: None

CT CERVICAL SPINE FINDINGS

Alignment: 2-3 mm anterolisthesis C4-5.  Cervical kyphosis

Skull base and vertebrae: No fracture identified

Soft tissues and spinal canal: Negative for mass or hematoma.
Extensive atherosclerotic calcification in the carotid arteries
bilaterally.

Disc levels: Diffuse disc and facet degeneration with spurring.
Multilevel foraminal encroachment due to spurring.

Upper chest: Lung apices clear bilaterally.

Other: None
IMPRESSION: 1. Motion degraded study
2. Atrophy and chronic microvascular ischemic change in the white
matter. No acute intracranial abnormality
3. Negative for cervical spine fracture. Multilevel cervical
spondylosis.
